# Patient Record
Sex: Female | Born: 1955 | Race: White | Hispanic: No | Marital: Married | Smoking: Never smoker
Health system: Southern US, Community
[De-identification: ages and names within clinical notes are randomized; demographics above are authoritative.]

## PROBLEM LIST (undated history)

## (undated) DIAGNOSIS — Z923 Personal history of irradiation: Secondary | ICD-10-CM

## (undated) DIAGNOSIS — K760 Fatty (change of) liver, not elsewhere classified: Secondary | ICD-10-CM

## (undated) DIAGNOSIS — E785 Hyperlipidemia, unspecified: Secondary | ICD-10-CM

## (undated) DIAGNOSIS — M1990 Unspecified osteoarthritis, unspecified site: Secondary | ICD-10-CM

## (undated) DIAGNOSIS — E039 Hypothyroidism, unspecified: Secondary | ICD-10-CM

## (undated) DIAGNOSIS — K2270 Barrett's esophagus without dysplasia: Secondary | ICD-10-CM

## (undated) DIAGNOSIS — C801 Malignant (primary) neoplasm, unspecified: Secondary | ICD-10-CM

## (undated) DIAGNOSIS — I10 Essential (primary) hypertension: Secondary | ICD-10-CM

## (undated) HISTORY — PX: LARYNX SURGERY: SHX692

## (undated) HISTORY — PX: COLONOSCOPY: SHX174

---

## 1990-09-20 DIAGNOSIS — Z923 Personal history of irradiation: Secondary | ICD-10-CM

## 1990-09-20 DIAGNOSIS — C801 Malignant (primary) neoplasm, unspecified: Secondary | ICD-10-CM

## 1990-09-20 HISTORY — DX: Malignant (primary) neoplasm, unspecified: C80.1

## 1990-09-20 HISTORY — DX: Personal history of irradiation: Z92.3

## 1999-09-30 ENCOUNTER — Other Ambulatory Visit: Admission: RE | Admit: 1999-09-30 | Discharge: 1999-09-30 | Payer: Self-pay | Admitting: Family Medicine

## 2000-09-14 ENCOUNTER — Other Ambulatory Visit: Admission: RE | Admit: 2000-09-14 | Discharge: 2000-09-14 | Payer: Self-pay | Admitting: Family Medicine

## 2004-08-06 ENCOUNTER — Ambulatory Visit: Payer: Self-pay | Admitting: Oncology

## 2004-08-20 ENCOUNTER — Ambulatory Visit: Payer: Self-pay | Admitting: Oncology

## 2005-06-03 ENCOUNTER — Ambulatory Visit: Payer: Self-pay | Admitting: Family Medicine

## 2005-08-05 ENCOUNTER — Ambulatory Visit: Payer: Self-pay | Admitting: Oncology

## 2005-08-20 ENCOUNTER — Ambulatory Visit: Payer: Self-pay | Admitting: Oncology

## 2006-06-06 ENCOUNTER — Ambulatory Visit: Payer: Self-pay | Admitting: Family Medicine

## 2007-09-28 ENCOUNTER — Ambulatory Visit: Payer: Self-pay | Admitting: Family Medicine

## 2007-11-03 ENCOUNTER — Ambulatory Visit: Payer: Self-pay | Admitting: Unknown Physician Specialty

## 2009-07-23 ENCOUNTER — Ambulatory Visit: Payer: Self-pay | Admitting: Family Medicine

## 2011-01-20 ENCOUNTER — Ambulatory Visit: Payer: Self-pay | Admitting: Family Medicine

## 2011-02-01 ENCOUNTER — Ambulatory Visit: Payer: Self-pay | Admitting: Family Medicine

## 2011-04-08 ENCOUNTER — Ambulatory Visit: Payer: Self-pay | Admitting: Unknown Physician Specialty

## 2011-04-10 LAB — PATHOLOGY REPORT

## 2011-08-18 ENCOUNTER — Ambulatory Visit: Payer: Self-pay | Admitting: Surgery

## 2012-03-27 ENCOUNTER — Emergency Department: Payer: Self-pay | Admitting: *Deleted

## 2012-08-22 ENCOUNTER — Ambulatory Visit: Payer: Self-pay | Admitting: Obstetrics and Gynecology

## 2012-08-22 LAB — URINALYSIS, COMPLETE
Bilirubin,UR: NEGATIVE
Blood: NEGATIVE
Glucose,UR: NEGATIVE mg/dL (ref 0–75)
Hyaline Cast: 8
Ketone: NEGATIVE
Nitrite: NEGATIVE
Ph: 5 (ref 4.5–8.0)
Protein: NEGATIVE
RBC,UR: 17 /HPF (ref 0–5)
Specific Gravity: 1.02 (ref 1.003–1.030)
Squamous Epithelial: 2
Transitional Epi: <1
WBC UR: 5 /HPF (ref 0–5)

## 2012-08-22 LAB — POTASSIUM: Potassium: 3.6 mmol/L (ref 3.5–5.1)

## 2012-08-22 LAB — HEMOGLOBIN: HGB: 14.5 g/dL (ref 12.0–16.0)

## 2012-08-25 ENCOUNTER — Ambulatory Visit: Payer: Self-pay | Admitting: Obstetrics and Gynecology

## 2012-08-28 LAB — PATHOLOGY REPORT

## 2012-10-25 ENCOUNTER — Ambulatory Visit: Payer: Self-pay | Admitting: Family Medicine

## 2013-12-04 ENCOUNTER — Ambulatory Visit: Payer: Self-pay | Admitting: Family Medicine

## 2014-05-31 ENCOUNTER — Ambulatory Visit: Payer: Self-pay | Admitting: Unknown Physician Specialty

## 2014-06-04 LAB — PATHOLOGY REPORT

## 2014-06-25 ENCOUNTER — Other Ambulatory Visit (HOSPITAL_COMMUNITY): Payer: Self-pay | Admitting: Cardiovascular Disease

## 2014-06-25 ENCOUNTER — Other Ambulatory Visit: Payer: Self-pay | Admitting: Physician Assistant

## 2014-06-26 NOTE — Telephone Encounter (Signed)
Medication denied. Pt has not been seen by Dr Burt Knack, no records available in [facility].

## 2014-12-10 ENCOUNTER — Ambulatory Visit: Payer: Self-pay | Admitting: Family Medicine

## 2014-12-11 ENCOUNTER — Ambulatory Visit: Payer: Self-pay | Admitting: Family Medicine

## 2014-12-12 ENCOUNTER — Ambulatory Visit: Admit: 2014-12-12 | Disposition: A | Discharge status: Home / Self Care | Payer: Self-pay | Admitting: Family Medicine

## 2014-12-12 HISTORY — PX: BREAST BIOPSY: SHX20

## 2015-01-07 NOTE — Op Note (Signed)
PATIENT NAME:  Brooke Payne, Brooke Payne MR#:  [id number] DATE OF BIRTH:  [birth year]  DATE OF PROCEDURE:  08/25/2012  PREOPERATIVE DIAGNOSIS: Postmenopausal bleeding.   POSTOPERATIVE DIAGNOSIS:  1. Postmenopausal bleeding. 2. Polyps.   PROCEDURES: Dilation and curettage, hysteroscopy.   SURGEON: Ricky L. Amalia Hailey, MD   ANESTHESIA: General endotracheal.   FINDINGS: Hysteroscopic evaluation consistent with polyps primarily from the uterine fundus, pink, fleshy, soft on gross examination. Minimal amount of curettings otherwise.   DRAINS: In and out catheter with a red rubber at the end of the case with approximately  50 mL of clear urine returned.   ANTIBIOTICS: 2 grams Ancef given IV preoperatively.   PROCEDURE IN DETAIL: The patient presented as above. Ultrasound was suspicious for polyps. We recommended dilation and curettage and hysteroscopy. Pt stated understanding  and consent was signed. Preoperative antibiotics were given. She was taken to the operating room and placed in supine position where anesthesia was initiated. She was then placed in the dorsal lithotomy position using Allen stirrups, prepped and draped in the usual sterile fashion. The cervix was visualized and grasped with a single-tooth tenaculum, easily dilated to permit the hysteroscope with findings as noted above. Then alternating sharp curette and polyp forceps and looking with the hysteroscope, we were able to remove the polyps and get a good global sampling.   Final inspection with the hysteroscope showed polyps had been removed. The procedure was felt to achieve maximum efficacy. Instruments were removed. The cervix was visualized and seemed to be hemostatic. The patient was returned to the supine position and left in the care of Anesthesia.  I anticipate a routine postoperative course. She will be discharged home with routine prescriptions and precautions, and I will see her back in two weeks or sooner as needed.    ____________________________ Rockey Situ. Amalia Hailey, MD rle:cbb D: 08/25/2012 08:06:34 ET T: 08/25/2012 10:07:11 ET JOB#: [id number]  cc: Ricky L. Amalia Hailey, MD, <Dictator> Selmer Dominion MD ELECTRONICALLY SIGNED 08/25/2012 11:36

## 2015-01-13 LAB — SURGICAL PATHOLOGY

## 2015-09-21 HISTORY — PX: CERVICAL SPINE SURGERY: SHX589

## 2015-11-04 ENCOUNTER — Other Ambulatory Visit: Payer: Self-pay | Admitting: Orthopedic Surgery

## 2015-11-04 DIAGNOSIS — M4722 Other spondylosis with radiculopathy, cervical region: Secondary | ICD-10-CM

## 2015-11-04 DIAGNOSIS — M5412 Radiculopathy, cervical region: Secondary | ICD-10-CM

## 2015-11-04 DIAGNOSIS — M5030 Other cervical disc degeneration, unspecified cervical region: Secondary | ICD-10-CM

## 2015-11-19 ENCOUNTER — Ambulatory Visit
Admission: RE | Admit: 2015-11-19 | Discharge: 2015-11-19 | Disposition: A | Discharge status: Home / Self Care | Payer: 59 | Source: Ambulatory Visit | Attending: Orthopedic Surgery | Admitting: Orthopedic Surgery

## 2015-11-19 DIAGNOSIS — M5030 Other cervical disc degeneration, unspecified cervical region: Secondary | ICD-10-CM | POA: Insufficient documentation

## 2015-11-19 DIAGNOSIS — M4722 Other spondylosis with radiculopathy, cervical region: Secondary | ICD-10-CM | POA: Insufficient documentation

## 2015-11-19 DIAGNOSIS — M50222 Other cervical disc displacement at C5-C6 level: Secondary | ICD-10-CM | POA: Diagnosis not present

## 2015-11-19 DIAGNOSIS — M4690 Unspecified inflammatory spondylopathy, site unspecified: Secondary | ICD-10-CM | POA: Diagnosis not present

## 2015-11-19 DIAGNOSIS — M5412 Radiculopathy, cervical region: Secondary | ICD-10-CM

## 2016-11-26 ENCOUNTER — Other Ambulatory Visit: Payer: Self-pay | Admitting: Family Medicine

## 2016-11-26 DIAGNOSIS — Z1231 Encounter for screening mammogram for malignant neoplasm of breast: Secondary | ICD-10-CM

## 2016-12-30 ENCOUNTER — Ambulatory Visit
Admission: RE | Admit: 2016-12-30 | Discharge: 2016-12-30 | Disposition: A | Discharge status: Home / Self Care | Payer: BLUE CROSS/BLUE SHIELD | Source: Ambulatory Visit | Attending: Family Medicine | Admitting: Family Medicine

## 2016-12-30 DIAGNOSIS — Z1231 Encounter for screening mammogram for malignant neoplasm of breast: Secondary | ICD-10-CM | POA: Diagnosis present

## 2016-12-30 HISTORY — DX: Malignant (primary) neoplasm, unspecified: C80.1

## 2016-12-30 HISTORY — DX: Personal history of irradiation: Z92.3

## 2017-01-18 ENCOUNTER — Other Ambulatory Visit: Payer: Self-pay | Admitting: Nurse Practitioner

## 2017-01-18 DIAGNOSIS — R748 Abnormal levels of other serum enzymes: Secondary | ICD-10-CM

## 2017-01-25 ENCOUNTER — Ambulatory Visit
Admission: RE | Admit: 2017-01-25 | Discharge: 2017-01-25 | Disposition: A | Discharge status: Home / Self Care | Payer: BLUE CROSS/BLUE SHIELD | Source: Ambulatory Visit | Attending: Nurse Practitioner | Admitting: Nurse Practitioner

## 2017-01-25 DIAGNOSIS — R748 Abnormal levels of other serum enzymes: Secondary | ICD-10-CM | POA: Insufficient documentation

## 2017-11-22 ENCOUNTER — Other Ambulatory Visit: Payer: Self-pay | Admitting: Family Medicine

## 2017-11-22 DIAGNOSIS — Z1231 Encounter for screening mammogram for malignant neoplasm of breast: Secondary | ICD-10-CM

## 2018-01-03 ENCOUNTER — Ambulatory Visit
Admission: RE | Admit: 2018-01-03 | Discharge: 2018-01-03 | Disposition: A | Discharge status: Home / Self Care | Payer: BLUE CROSS/BLUE SHIELD | Source: Ambulatory Visit | Attending: Family Medicine | Admitting: Family Medicine

## 2018-01-03 DIAGNOSIS — Z1231 Encounter for screening mammogram for malignant neoplasm of breast: Secondary | ICD-10-CM | POA: Diagnosis present

## 2018-02-17 ENCOUNTER — Other Ambulatory Visit: Payer: Self-pay

## 2018-02-17 ENCOUNTER — Encounter: Payer: Self-pay | Admitting: *Deleted

## 2018-02-17 ENCOUNTER — Emergency Department
Admission: EM | Admit: 2018-02-17 | Discharge: 2018-02-18 | Disposition: A | Discharge status: Home / Self Care | Payer: BLUE CROSS/BLUE SHIELD | Attending: Student in an Organized Health Care Education/Training Program | Admitting: Student in an Organized Health Care Education/Training Program

## 2018-02-17 DIAGNOSIS — S0591XA Unspecified injury of right eye and orbit, initial encounter: Secondary | ICD-10-CM | POA: Diagnosis present

## 2018-02-17 DIAGNOSIS — S0501XA Injury of conjunctiva and corneal abrasion without foreign body, right eye, initial encounter: Secondary | ICD-10-CM | POA: Diagnosis not present

## 2018-02-17 DIAGNOSIS — X58XXXA Exposure to other specified factors, initial encounter: Secondary | ICD-10-CM | POA: Diagnosis not present

## 2018-02-17 DIAGNOSIS — Y929 Unspecified place or not applicable: Secondary | ICD-10-CM | POA: Insufficient documentation

## 2018-02-17 DIAGNOSIS — Y9389 Activity, other specified: Secondary | ICD-10-CM | POA: Insufficient documentation

## 2018-02-17 DIAGNOSIS — Y999 Unspecified external cause status: Secondary | ICD-10-CM | POA: Diagnosis not present

## 2018-02-17 MED ORDER — TETRACAINE HCL 0.5 % OP SOLN
2.0000 [drp] | Freq: Once | OPHTHALMIC | Status: AC
  Administered 2018-02-17: 2 [drp] via OPHTHALMIC
  Filled 2018-02-17: qty 4

## 2018-02-17 MED ORDER — FLUORESCEIN SODIUM 1 MG OP STRP
1.0000 | ORAL_STRIP | Freq: Once | OPHTHALMIC | Status: AC
  Administered 2018-02-17: 1 via OPHTHALMIC
  Filled 2018-02-17: qty 1

## 2018-02-17 NOTE — ED Triage Notes (Signed)
Pt reports she was unable to get her right eye soft contact out. She says that she went to [facility] prior and they used numbing drops and "got pieces of it out". Redness and irritation to the right eye.

## 2018-02-18 MED ORDER — CIPROFLOXACIN HCL 0.3 % OP SOLN: 1.0000 [drp] | OPHTHALMIC | 0 refills | Status: AC

## 2018-02-18 MED ORDER — CIPROFLOXACIN HCL 0.3 % OP SOLN
2.0000 [drp] | Freq: Once | OPHTHALMIC | Status: AC
  Administered 2018-02-18: 2 [drp] via OPHTHALMIC
  Filled 2018-02-18: qty 2.5

## 2018-02-18 NOTE — ED Provider Notes (Signed)
[facility] Emergency Department Provider Note  ____________________________________________  Time seen: Approximately 3:18 PM  I have reviewed the triage vital signs and the nursing notes.   HISTORY  Chief Complaint Foreign Body in Eye    HPI Brooke Payne is a 62 y.o. female that presents to the emergency department for concern of foreign body in right eye.  Patient states that this afternoon she tried to get her contact out and was unsuccessful.  [facility] and they were "getting pieces out."  She came to the emergency department because [facility] couldn't get the whole contact out and were just removing pieces. She can no longer tell whether it feels like her contact is in her eye because her eye is so irritated.  She denies any symptoms prior to attempting to remove contact. She has been wearing contacts for a couple of months. She denies headache, visual changes, floaters, flashers, eye pain, photophobia, nausea, vomiting, abdominal pain.  Past Medical History:  Diagnosis Date  . 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 Personal history of radiation therapy 1992    There are no active problems to display for this patient.   Past Surgical History:  Procedure Laterality Date  . BREAST BIOPSY Left 12/12/2014   benign    Prior to Admission medications   Medication Sig Start Date End Date Taking? Authorizing Provider  ciprofloxacin (CILOXAN) 0.3 % ophthalmic solution Place 1 drop into the right eye every 2 (two) hours while awake for 5 days. Administer 1 drop, every 2 hours, while awake, for 2 days. Then 1 drop, every 4 hours, while awake, for the next 5 days. 02/18/18 02/23/18  Laban Emperor, PA-C    Allergies Azithromycin  Family History  Problem Relation Age of Onset  . Breast cancer Neg Hx     Social History Social History   Tobacco Use  . Smoking status: Never Smoker  Substance Use Topics  . Alcohol use: Never    Frequency: Never   . Drug use: Never     Review of Systems  Cardiovascular: No chest pain. Respiratory: No SOB. Gastrointestinal: No nausea, no vomiting.  Skin: Negative for rash, abrasions, lacerations, ecchymosis. Neurological: Negative for headaches   ____________________________________________   PHYSICAL EXAM:  VITAL SIGNS: ED Triage Vitals  Enc Vitals Group     BP 02/17/18 2025 (!) 173/98     Pulse Rate 02/17/18 2025 96     Resp 02/17/18 2025 16     Temp 02/17/18 2025 98 F (36.7 C)     Temp Source 02/17/18 2025 Oral     SpO2 02/17/18 2025 100 %     Weight 02/17/18 2025 245 lb (111.1 kg)     Height 02/17/18 2025 5\' 7"  (1.702 m)     Head Circumference --      Peak Flow --      Pain Score 02/17/18 2031 8     Pain Loc --      Pain Edu? --      Excl. in [place]? --      Constitutional: Alert and oriented. Well appearing and in no acute distress. Eyes: Right conjunctiva is injected. PERRL. EOMI. 1/4cm circular area of fluorescene uptake to superior iris and linear corneal defect over right pupil. No visualized foreign body. Head:  ENT:      Ears:      Nose: No congestion/rhinnorhea.      Mouth/Throat: Mucous membranes are moist.  Neck:  No stridor.  Cardiovascular: Good peripheral circulation. Respiratory: Normal respiratory effort without tachypnea or retractions.  Musculoskeletal: Full range of motion to all extremities. No gross deformities appreciated. Neurologic:  Normal speech and language. No gross focal neurologic deficits are appreciated.  Skin:  Skin is warm, dry and intact. No rash noted. Psychiatric: Mood and affect are normal. Speech and behavior are normal. Patient exhibits appropriate insight and judgement.   ____________________________________________   LABS (all labs ordered are listed, but only abnormal results are displayed)  Labs Reviewed - No data to  display ____________________________________________  EKG   ____________________________________________  RADIOLOGY   No results found.  ____________________________________________    PROCEDURES  Procedure(s) performed:    Procedures    Medications  tetracaine (PONTOCAINE) 0.5 % ophthalmic solution 2 drop (2 drops Right Eye Given by Other 02/17/18 2335)  fluorescein ophthalmic strip 1 strip (1 strip Right Eye Given by Other 02/17/18 2335)  ciprofloxacin (CILOXAN) 0.3 % ophthalmic solution 2 drop (2 drops Right Eye Given 02/18/18 0033)     ____________________________________________   INITIAL IMPRESSION / ASSESSMENT AND PLAN / ED COURSE  Pertinent labs & imaging results that were available during my care of the patient were reviewed by me and considered in my medical decision making (see chart for details).  Review of the [place] CSRS was performed in accordance of the [place] prior to dispensing any controlled drugs.   Patient's diagnosis is consistent with corneal abrasion and corneal avulsion. Vital signs and exam are reassuring. Eye was irrigated with morgan lens. I did not visualize any foreign body. I did not attempt any foreign body removal. There is a large area of uptake over iris. I am concerned for a corneal avulsion and corneal abrasion following previous attempted removal of contact . Patient denies any vision changes, eye pain, or systemic symptoms. Patient will be discharged home with prescriptions for ciprofloxacin eye drops. Patient is to follow up with opthamology on Monday. Patient is given ED precautions to return to the ED for any worsening or new symptoms.     ____________________________________________  FINAL CLINICAL IMPRESSION(S) / ED DIAGNOSES  Final diagnoses:  Abrasion of right cornea, initial encounter      NEW MEDICATIONS STARTED DURING THIS VISIT:  ED Discharge Orders        Ordered    ciprofloxacin (CILOXAN) 0.3 % ophthalmic  solution  Every 2 hours while awake     02/18/18 0000          This chart was dictated using voice recognition software/Dragon. Despite best efforts to proofread, errors can occur which can change the meaning. Any change was purely unintentional.    Laban Emperor, PA-C 02/19/18 1537    Merlyn Lot, MD 02/21/18 [phone]

## 2019-01-22 ENCOUNTER — Other Ambulatory Visit: Payer: Self-pay | Admitting: Family Medicine

## 2019-01-22 DIAGNOSIS — Z1231 Encounter for screening mammogram for malignant neoplasm of breast: Secondary | ICD-10-CM

## 2019-03-26 ENCOUNTER — Ambulatory Visit
Admission: RE | Admit: 2019-03-26 | Discharge: 2019-03-26 | Disposition: A | Discharge status: Home / Self Care | Payer: Medicare Other | Source: Ambulatory Visit | Attending: Family Medicine | Admitting: Family Medicine

## 2019-03-26 DIAGNOSIS — Z1231 Encounter for screening mammogram for malignant neoplasm of breast: Secondary | ICD-10-CM

## 2019-10-29 ENCOUNTER — Ambulatory Visit: Payer: Medicare Other | Attending: Internal Medicine

## 2019-10-29 DIAGNOSIS — Z23 Encounter for immunization: Secondary | ICD-10-CM | POA: Insufficient documentation

## 2019-10-29 NOTE — Progress Notes (Signed)
   Covid-19 Vaccination Clinic  Name:  Brooke Payne    MRN: [id number] DOB: [birth year]  10/29/2019  [name] was observed post Covid-19 immunization for 15 minutes without incidence. She was provided with Vaccine Information Sheet and instruction to access the V-Safe system.   [name] was instructed to call 911 with any severe reactions post vaccine: Marland Kitchen Difficulty breathing  . Swelling of your face and throat  . A fast heartbeat  . A bad rash all over your body  . Dizziness and weakness    Immunizations Administered    Name Date Dose VIS Date Route   Pfizer COVID-19 Vaccine 10/29/2019  9:05 AM 0.3 mL 08/31/2019 Intramuscular   Manufacturer: [place]   Lot: [id number]   [place]: [id number]

## 2019-11-21 ENCOUNTER — Ambulatory Visit: Payer: Medicare Other | Attending: Internal Medicine

## 2019-11-21 DIAGNOSIS — Z23 Encounter for immunization: Secondary | ICD-10-CM

## 2019-11-21 NOTE — Progress Notes (Signed)
   Covid-19 Vaccination Clinic  Name:  Brooke Payne    MRN: [id number] DOB: [birth year]  11/21/2019  [name] was observed post Covid-19 immunization for 15 minutes without incident. She was provided with Vaccine Information Sheet and instruction to access the V-Safe system.   [name] was instructed to call 911 with any severe reactions post vaccine: Marland Kitchen Difficulty breathing  . Swelling of face and throat  . A fast heartbeat  . A bad rash all over body  . Dizziness and weakness   Immunizations Administered    Name Date Dose VIS Date Route   Pfizer COVID-19 Vaccine 11/21/2019  9:44 AM 0.3 mL 08/31/2019 Intramuscular   Manufacturer: [place]   Lot: [id number]   [place]: [id number]

## 2020-03-10 ENCOUNTER — Other Ambulatory Visit: Payer: Self-pay | Admitting: Family Medicine

## 2020-03-10 DIAGNOSIS — Z1231 Encounter for screening mammogram for malignant neoplasm of breast: Secondary | ICD-10-CM

## 2020-03-26 ENCOUNTER — Ambulatory Visit
Admission: RE | Admit: 2020-03-26 | Discharge: 2020-03-26 | Disposition: A | Discharge status: Home / Self Care | Payer: Medicare Other | Source: Ambulatory Visit | Attending: Family Medicine | Admitting: Family Medicine

## 2020-03-26 DIAGNOSIS — Z1231 Encounter for screening mammogram for malignant neoplasm of breast: Secondary | ICD-10-CM | POA: Insufficient documentation

## 2020-04-22 ENCOUNTER — Other Ambulatory Visit: Payer: Self-pay | Admitting: Family Medicine

## 2020-04-22 DIAGNOSIS — M5415 Radiculopathy, thoracolumbar region: Secondary | ICD-10-CM

## 2020-05-08 ENCOUNTER — Ambulatory Visit
Admission: RE | Admit: 2020-05-08 | Discharge: 2020-05-08 | Disposition: A | Discharge status: Home / Self Care | Payer: Medicare Other | Source: Ambulatory Visit | Attending: Family Medicine | Admitting: Family Medicine

## 2020-05-08 ENCOUNTER — Other Ambulatory Visit: Payer: Self-pay

## 2020-05-08 DIAGNOSIS — M5415 Radiculopathy, thoracolumbar region: Secondary | ICD-10-CM

## 2021-03-02 ENCOUNTER — Other Ambulatory Visit: Payer: Self-pay | Admitting: Family Medicine

## 2021-03-02 DIAGNOSIS — Z1231 Encounter for screening mammogram for malignant neoplasm of breast: Secondary | ICD-10-CM

## 2021-03-27 ENCOUNTER — Other Ambulatory Visit: Payer: Self-pay

## 2021-03-27 ENCOUNTER — Ambulatory Visit
Admission: RE | Admit: 2021-03-27 | Discharge: 2021-03-27 | Disposition: A | Discharge status: Home / Self Care | Payer: Medicare Other | Source: Ambulatory Visit | Attending: Family Medicine | Admitting: Family Medicine

## 2021-03-27 DIAGNOSIS — Z1231 Encounter for screening mammogram for malignant neoplasm of breast: Secondary | ICD-10-CM

## 2022-02-11 ENCOUNTER — Other Ambulatory Visit: Payer: Self-pay | Admitting: Family Medicine

## 2022-02-11 DIAGNOSIS — Z1231 Encounter for screening mammogram for malignant neoplasm of breast: Secondary | ICD-10-CM

## 2022-03-29 ENCOUNTER — Ambulatory Visit
Admission: RE | Admit: 2022-03-29 | Discharge: 2022-03-29 | Disposition: A | Discharge status: Home / Self Care | Payer: Medicare Other | Source: Ambulatory Visit | Attending: Family Medicine | Admitting: Family Medicine

## 2022-03-29 DIAGNOSIS — Z1231 Encounter for screening mammogram for malignant neoplasm of breast: Secondary | ICD-10-CM | POA: Insufficient documentation

## 2022-04-09 IMAGING — MR MR LUMBAR SPINE W/O CM
5 series · 31 of 48 positions shown · non-contrast
Comparison: None.

CLINICAL DATA: Low back pain right knee pain

EXAM:
MRI LUMBAR SPINE WITHOUT CONTRAST
TECHNIQUE: Multiplanar, multisequence MR imaging of the lumbar spine was
performed. No intravenous contrast was administered.

[Series 5: T2 · sagittal · 4.0mm · 0.81mm/px · 6 of 17 slices shown (1 of 2)]
[im 1/17]
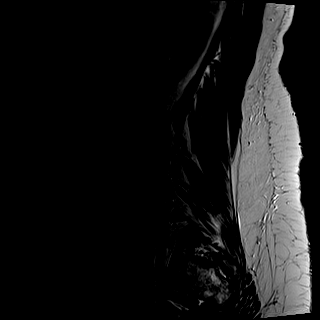
[im 4/17]
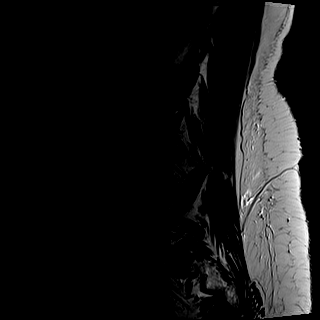
[im 7/17]
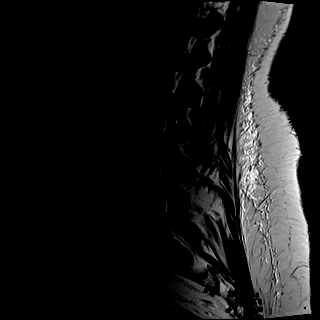
[im 10/17]
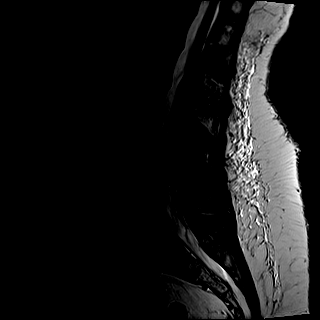
[im 13/17]
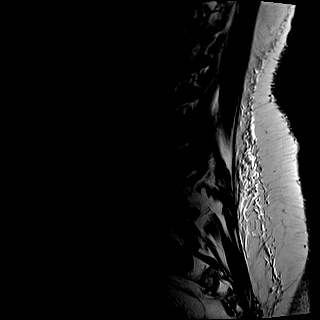
[im 17/17]
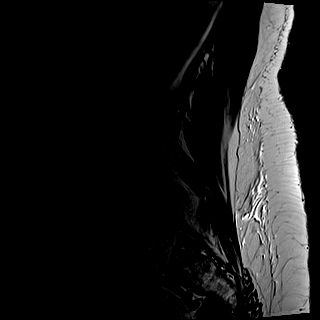

[Series 6: T1 · sagittal · 4.0mm · 0.81mm/px · 7 of 17 slices shown (1 of 2)]
[im 1/17]
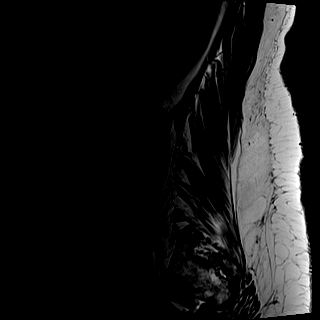
[im 3/17]
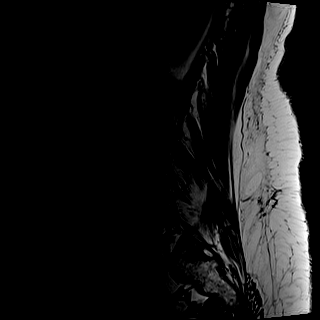
[im 6/17]
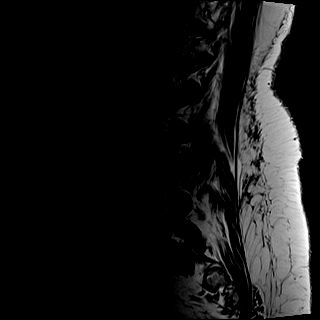
[im 9/17]
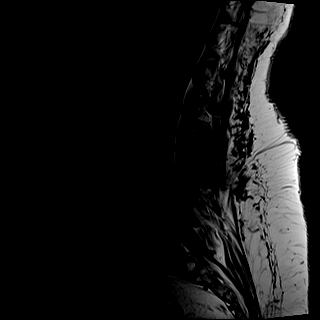
[im 11/17]
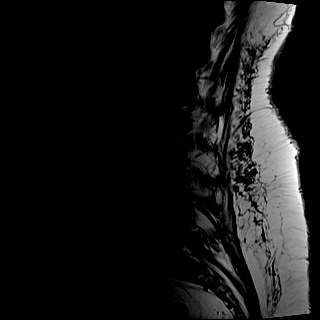
[im 14/17]
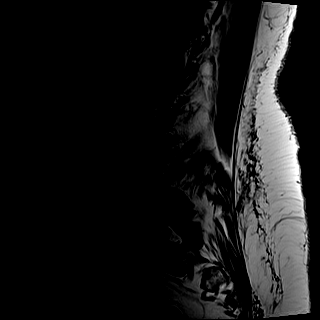
[im 17/17]
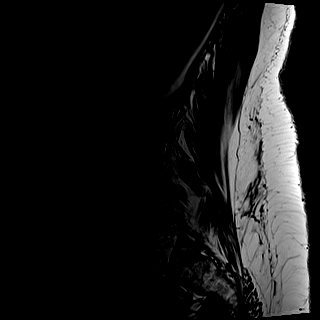

[Series 7: STIR · sagittal · 4.0mm · 0.41mm/px · 2 of 17 slices shown]
[im 1/17]
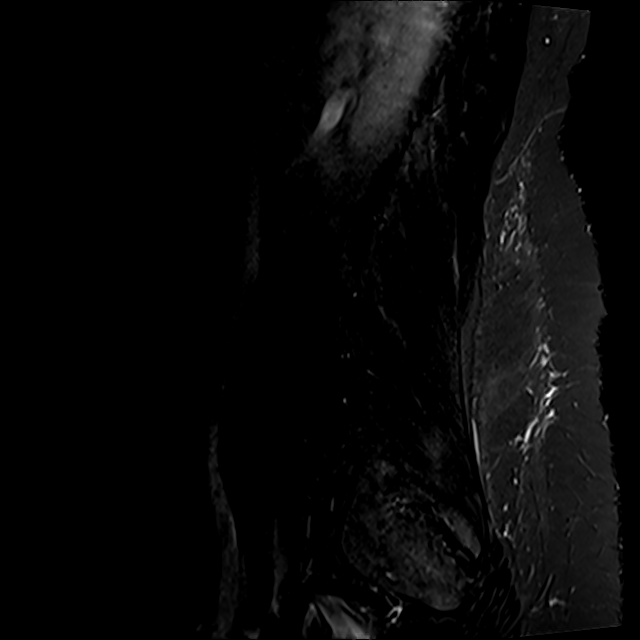
[im 3/17]
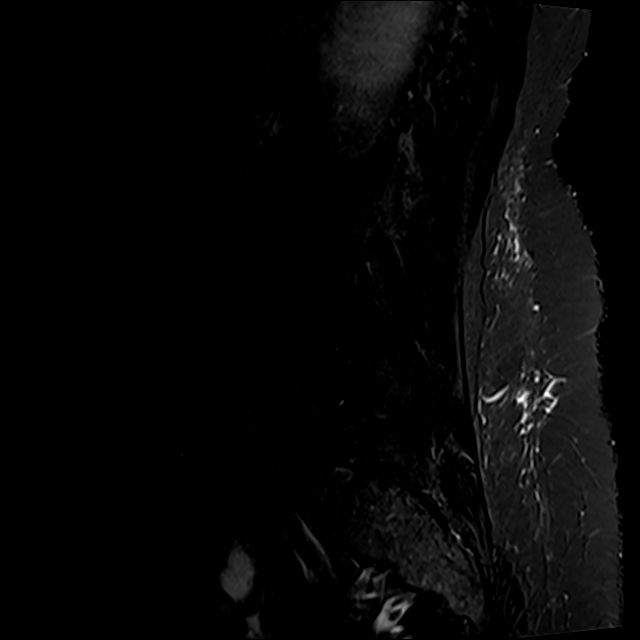

[Series 8: T2 · axial · 4.0mm · 0.78mm/px · z∈[-105,+109]mm · 8 of 37 slices shown (2 of 2)]
[im 1/37]
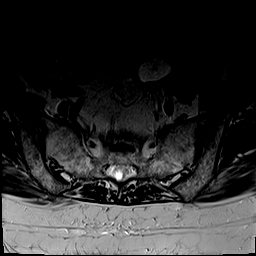
[im 6/37]
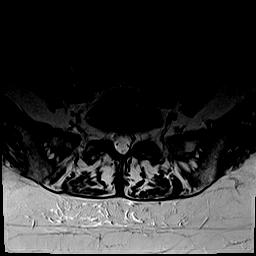
[im 12/37]
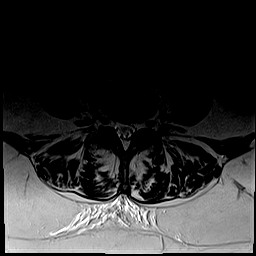
[im 17/37]
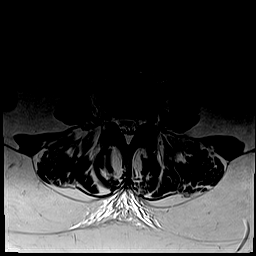
[im 20/37]
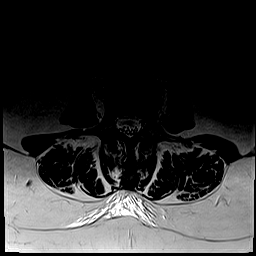
[im 25/37]
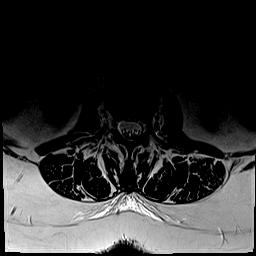
[im 31/37]
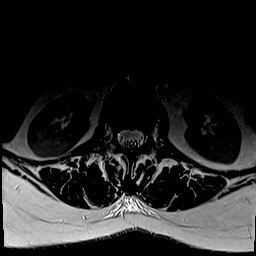
[im 37/37]
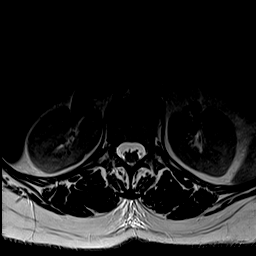

[Series 9: T1 · axial · 4.0mm · 0.39mm/px · z∈[-105,+109]mm · 8 of 37 slices shown (2 of 2)]
[im 1/37]
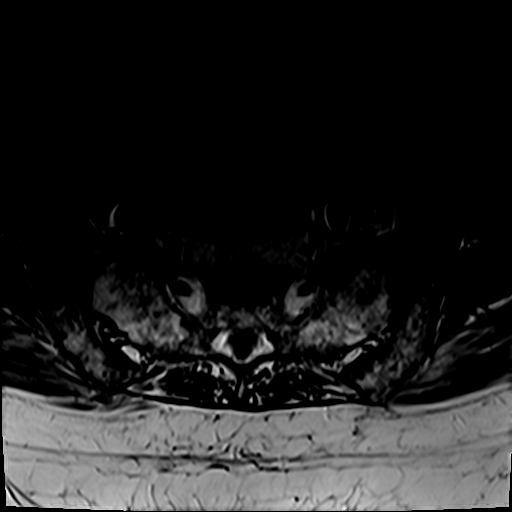
[im 6/37]
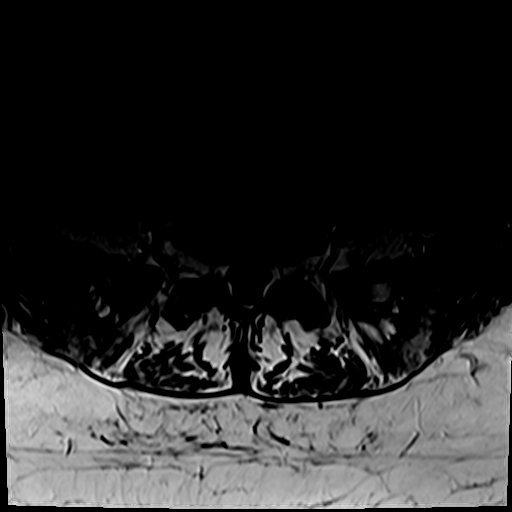
[im 12/37]
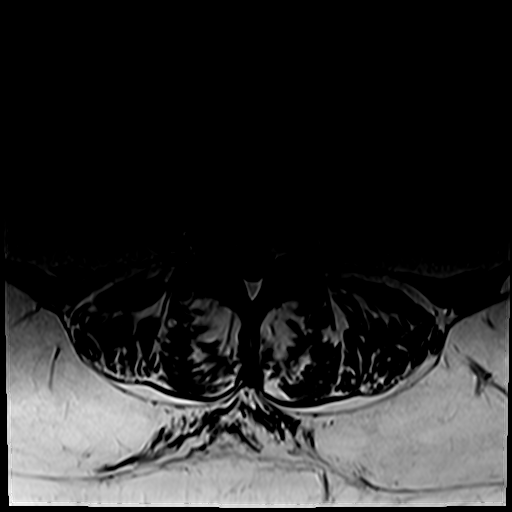
[im 17/37]
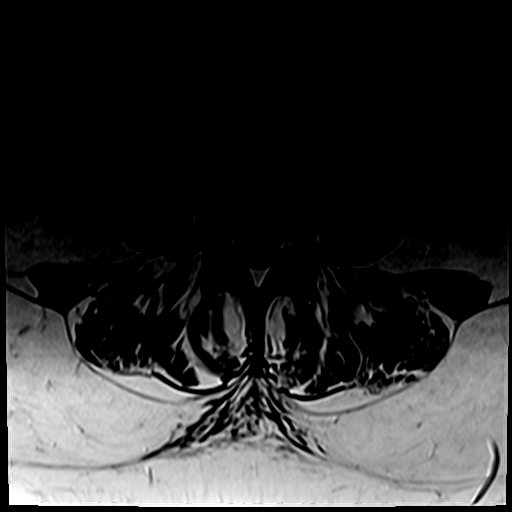
[im 20/37]
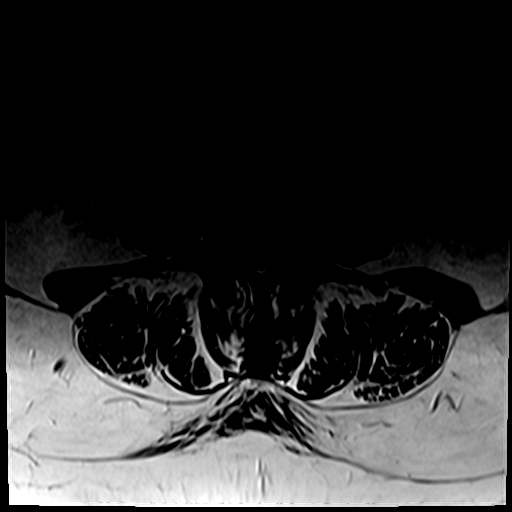
[im 25/37]
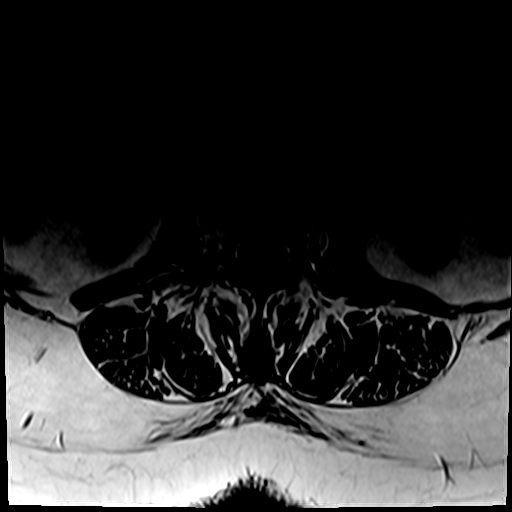
[im 31/37]
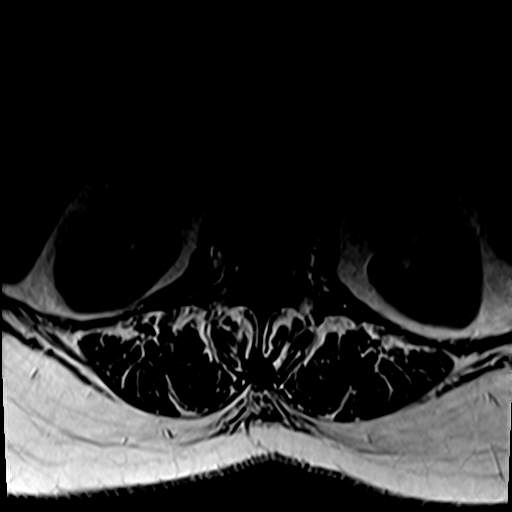
[im 37/37]
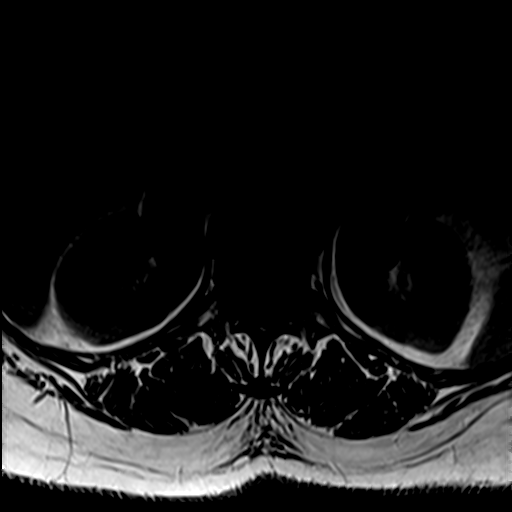

[31 of 48 positions shown; findings below may reference images not displayed]

FINDINGS: Segmentation:  Standard.

Alignment:  Physiologic.

Vertebrae:  No fracture, evidence of discitis, or bone lesion.

Conus medullaris and cauda equina: Conus extends to the L1 level.
Conus and cauda equina appear normal.

Paraspinal and other soft tissues: Negative.

Disc levels:

The visualized levels above L2 are normal.

L2-3: Mild facet hypertrophy.  Otherwise normal.

L3-4: Mild facet hypertrophy. Left extraforaminal annular fissure.
No spinal canal or neural foraminal stenosis.

L4-5: Moderate facet hypertrophy small left asymmetric disc bulge.
No spinal canal or neural foraminal stenosis.

L5-S1: Moderate facet hypertrophy.  No disc herniation or stenosis.
IMPRESSION: 1. Moderate lower lumbar facet arthrosis, which may serve as a
source of local low back pain.
2. Mild lower lumbar degenerative disc disease without spinal canal
or neural foraminal stenosis.

## 2022-09-02 ENCOUNTER — Other Ambulatory Visit: Payer: Self-pay | Admitting: Surgery

## 2022-09-10 ENCOUNTER — Encounter
Admission: RE | Admit: 2022-09-10 | Discharge: 2022-09-10 | Disposition: A | Discharge status: Home / Self Care | Payer: Medicare Other | Source: Ambulatory Visit | Attending: Surgery | Admitting: Surgery

## 2022-09-10 DIAGNOSIS — Z01818 Encounter for other preprocedural examination: Secondary | ICD-10-CM | POA: Diagnosis not present

## 2022-09-10 DIAGNOSIS — Z9889 Other specified postprocedural states: Secondary | ICD-10-CM

## 2022-09-10 DIAGNOSIS — R8290 Unspecified abnormal findings in urine: Secondary | ICD-10-CM | POA: Diagnosis not present

## 2022-09-10 DIAGNOSIS — Z01810 Encounter for preprocedural cardiovascular examination: Secondary | ICD-10-CM | POA: Diagnosis not present

## 2022-09-10 DIAGNOSIS — Z01812 Encounter for preprocedural laboratory examination: Secondary | ICD-10-CM

## 2022-09-10 HISTORY — DX: Hypothyroidism, unspecified: E03.9

## 2022-09-10 HISTORY — DX: Barrett's esophagus without dysplasia: K22.70

## 2022-09-10 HISTORY — DX: Hyperlipidemia, unspecified: E78.5

## 2022-09-10 HISTORY — DX: Essential (primary) hypertension: I10

## 2022-09-10 HISTORY — DX: Fatty (change of) liver, not elsewhere classified: K76.0

## 2022-09-10 HISTORY — DX: Unspecified osteoarthritis, unspecified site: M19.90

## 2022-09-10 LAB — COMPREHENSIVE METABOLIC PANEL
ALT: 30 U/L (ref 0–44)
AST: 27 U/L (ref 15–41)
Albumin: 3.9 g/dL (ref 3.5–5.0)
Alkaline Phosphatase: 37 U/L — ABNORMAL LOW (ref 38–126)
Anion gap: 6 (ref 5–15)
BUN: 10 mg/dL (ref 8–23)
CO2: 26 mmol/L (ref 22–32)
Calcium: 10 mg/dL (ref 8.9–10.3)
Chloride: 105 mmol/L (ref 98–111)
Creatinine, Ser: 0.87 mg/dL (ref 0.44–1.00)
GFR, Estimated: >60 mL/min (ref >60)
Glucose, Bld: 105 mg/dL — ABNORMAL HIGH (ref 70–99)
Potassium: 3.6 mmol/L (ref 3.5–5.1)
Sodium: 137 mmol/L (ref 135–145)
Total Bilirubin: 1.2 mg/dL (ref 0.3–1.2)
Total Protein: 7.4 g/dL (ref 6.5–8.1)

## 2022-09-10 LAB — URINALYSIS, ROUTINE W REFLEX MICROSCOPIC
Bilirubin Urine: NEGATIVE
Glucose, UA: NEGATIVE mg/dL
Hgb urine dipstick: NEGATIVE
Ketones, ur: 5 mg/dL — AB
Nitrite: NEGATIVE
Protein, ur: 30 mg/dL — AB
Specific Gravity, Urine: 1.027 (ref 1.005–1.030)
pH: 5 (ref 5.0–8.0)

## 2022-09-10 LAB — CBC WITH DIFFERENTIAL/PLATELET
Abs Immature Granulocytes: 0.01 10*3/uL (ref 0.00–0.07)
Basophils Absolute: 0 10*3/uL (ref 0.0–0.1)
Basophils Relative: 1 %
Eosinophils Absolute: 0 10*3/uL (ref 0.0–0.5)
Eosinophils Relative: 1 %
HCT: 47.3 % — ABNORMAL HIGH (ref 36.0–46.0)
Hemoglobin: 15.6 g/dL — ABNORMAL HIGH (ref 12.0–15.0)
Immature Granulocytes: 0 %
Lymphocytes Relative: 11 %
Lymphs Abs: 0.4 10*3/uL — ABNORMAL LOW (ref 0.7–4.0)
MCH: 28.2 pg (ref 26.0–34.0)
MCHC: 33 g/dL (ref 30.0–36.0)
MCV: 85.5 fL (ref 80.0–100.0)
Monocytes Absolute: 0.3 10*3/uL (ref 0.1–1.0)
Monocytes Relative: 8 %
Neutro Abs: 3.2 10*3/uL (ref 1.7–7.7)
Neutrophils Relative %: 79 %
Platelets: 196 10*3/uL (ref 150–400)
RBC: 5.53 MIL/uL — ABNORMAL HIGH (ref 3.87–5.11)
RDW: 14 % (ref 11.5–15.5)
WBC: 3.9 10*3/uL — ABNORMAL LOW (ref 4.0–10.5)
nRBC: 0 % (ref 0.0–0.2)

## 2022-09-10 LAB — TYPE AND SCREEN
ABO/RH(D): A POS
Antibody Screen: NEGATIVE

## 2022-09-10 LAB — SURGICAL PCR SCREEN
MRSA, PCR: NEGATIVE
Staphylococcus aureus: NEGATIVE

## 2022-09-10 NOTE — Patient Instructions (Signed)
Your procedure is scheduled on:09-23-21 Thursday Report to the Registration Desk on the 1st floor of the [facility].Then proceed to the 2nd floor Surgery Desk To find out your arrival time, please call [phone] between 1PM - 3PM on:09-22-21 Wednesday If your arrival time is 6:00 am, do not arrive prior to that time as the [place] [facility] entrance doors do not open until 6:00 am.  REMEMBER: Instructions that are not followed completely may result in serious medical risk, up to and including death; or upon the discretion of your surgeon and anesthesiologist your surgery may need to be rescheduled.  Do not eat food after midnight the night before surgery.  No gum chewing, lozengers or hard candies.  You may however, drink CLEAR liquids up to 2 hours before you are scheduled to arrive for your surgery. Do not drink anything within 2 hours of your scheduled arrival time.  Clear liquids include: - water  - apple juice without pulp - gatorade (not RED colors) - black coffee or tea (Do NOT add milk or creamers to the coffee or tea) Do NOT drink anything that is not on this list.  In addition, your doctor has ordered for you to drink the provided  Ensure Pre-Surgery Clear Carbohydrate Drink  Drinking this carbohydrate drink up to two hours before surgery helps to reduce insulin resistance and improve patient outcomes. Please complete drinking 2 hours prior to scheduled arrival time.  TAKE THESE MEDICATIONS THE MORNING OF SURGERY WITH A SIP OF WATER: -levothyroxine (SYNTHROID)  -rosuvastatin (CRESTOR)  -omeprazole (PRILOSEC) -take one the night before and one on the morning of surgery - helps to prevent nausea after surgery.)  One week prior to surgery: Stop Anti-inflammatories (NSAIDS) such as Advil, Aleve, Ibuprofen, Motrin, Naproxen, Naprosyn and Aspirin based products such as Excedrin, Goodys Powder, BC Powder.You may however, continue to take Tylenol if needed for pain up until the day of  surgery. You may continue your celecoxib (CELEBREX) up until the day prior to surgery  Stop ANY OVER THE COUNTER supplements/vitamins 7 days prior to surgery (flaxseed oil)  No Alcohol for 24 hours before or after surgery.  No Smoking including e-cigarettes for 24 hours prior to surgery.  No chewable tobacco products for at least 6 hours prior to surgery.  No nicotine patches on the day of surgery.  Do not use any "recreational" drugs for at least a week prior to your surgery.  Please be advised that the combination of cocaine and anesthesia may have negative outcomes, up to and including death. If you test positive for cocaine, your surgery will be cancelled.  On the morning of surgery brush your teeth with toothpaste and water, you may rinse your mouth with mouthwash if you wish. Do not swallow any toothpaste or mouthwash.  Use CHG Soap as directed on instruction sheet.  Do not wear jewelry, make-up, hairpins, clips or nail polish.  Do not wear lotions, powders, or perfumes.   Do not shave body from the neck down 48 hours prior to surgery just in case you cut yourself which could leave a site for infection.  Also, freshly shaved skin may become irritated if using the CHG soap.  Contact lenses, hearing aids and dentures may not be worn into surgery.  Do not bring valuables to the hospital. [facility] is not responsible for any missing/lost belongings or valuables.  Notify your doctor if there is any change in your medical condition (cold, fever, infection).  Wear comfortable clothing (specific  to your surgery type) to the hospital.  After surgery, you can help prevent lung complications by doing breathing exercises.  Take deep breaths and cough every 1-2 hours. Your doctor may order a device called an Incentive Spirometer to help you take deep breaths. When coughing or sneezing, hold a pillow firmly against your incision with both hands. This is called "splinting." Doing this  helps protect your incision. It also decreases belly discomfort.  If you are being admitted to the hospital overnight, leave your suitcase in the car. After surgery it may be brought to your room.  If you are being discharged the day of surgery, you will not be allowed to drive home. You will need a responsible adult (18 years or older) to drive you home and stay with you that night.   If you are taking public transportation, you will need to have a responsible adult (18 years or older) with you. Please confirm with your physician that it is acceptable to use public transportation.   Please call the [place] [facility] at [phone] if you have any questions about these instructions.  Surgery Visitation Policy:  Patients undergoing a surgery or procedure may have two family members or support persons with them as long as the person is not COVID-19 positive or experiencing its symptoms.   Inpatient Visitation:    Visiting hours are 7 a.m. to 8 p.m. Up to four visitors are allowed at one time in a patient room. The visitors may rotate out with other people during the day. One designated support person (adult) may remain overnight.  Due to an increase in RSV and influenza rates and associated hospitalizations, children ages 36 and under will not be able to visit patients in [facility]. Masks continue to be strongly recommended.   How to Use an Incentive Spirometer An incentive spirometer is a tool that measures how well you are filling your lungs with each breath. Learning to take long, deep breaths using this tool can help you keep your lungs clear and active. This may help to reverse or lessen your chance of developing breathing (pulmonary) problems, especially infection. You may be asked to use a spirometer: After a surgery. If you have a lung problem or a history of smoking. After a long period of time when you have been unable to move or be active. If the  spirometer includes an indicator to show the highest number that you have reached, your health care provider or respiratory therapist will help you set a goal. Keep a log of your progress as told by your health care provider. What are the risks? Breathing too quickly may cause dizziness or cause you to pass out. Take your time so you do not get dizzy or light-headed. If you are in pain, you may need to take pain medicine before doing incentive spirometry. It is harder to take a deep breath if you are having pain. How to use your incentive spirometer  Sit up on the edge of your bed or on a chair. Hold the incentive spirometer so that it is in an upright position. Before you use the spirometer, breathe out normally. Place the mouthpiece in your mouth. Make sure your lips are closed tightly around it. Breathe in slowly and as deeply as you can through your mouth, causing the piston or the ball to rise toward the top of the chamber. Hold your breath for 3-5 seconds, or for as long as possible. If the spirometer includes  a coach indicator, use this to guide you in breathing. Slow down your breathing if the indicator goes above the marked areas. Remove the mouthpiece from your mouth and breathe out normally. The piston or ball will return to the bottom of the chamber. Rest for a few seconds, then repeat the steps 10 or more times. Take your time and take a few normal breaths between deep breaths so that you do not get dizzy or light-headed. Do this every 1-2 hours when you are awake. If the spirometer includes a goal marker to show the highest number you have reached (best effort), use this as a goal to work toward during each repetition. After each set of 10 deep breaths, cough a few times. This will help to make sure that your lungs are clear. If you have an incision on your chest or abdomen from surgery, place a pillow or a rolled-up towel firmly against the incision when you cough. This can help to  reduce pain while taking deep breaths and coughing. General tips When you are able to get out of bed: Walk around often. Continue to take deep breaths and cough in order to clear your lungs. Keep using the incentive spirometer until your health care provider says it is okay to stop using it. If you have been in the hospital, you may be told to keep using the spirometer at home. Contact a health care provider if: You are having difficulty using the spirometer. You have trouble using the spirometer as often as instructed. Your pain medicine is not giving enough relief for you to use the spirometer as told. You have a fever. Get help right away if: You develop shortness of breath. You develop a cough with bloody mucus from the lungs. You have fluid or blood coming from an incision site after you cough. Summary An incentive spirometer is a tool that can help you learn to take long, deep breaths to keep your lungs clear and active. You may be asked to use a spirometer after a surgery, if you have a lung problem or a history of smoking, or if you have been inactive for a long period of time. Use your incentive spirometer as instructed every 1-2 hours while you are awake. If you have an incision on your chest or abdomen, place a pillow or a rolled-up towel firmly against your incision when you cough. This will help to reduce pain. Get help right away if you have shortness of breath, you cough up bloody mucus, or blood comes from your incision when you cough. This information is not intended to replace advice given to you by your health care provider. Make sure you discuss any questions you have with your health care provider. Document Revised: 11/26/2019 Document Reviewed: 11/26/2019 Elsevier Patient Education  [place].

## 2022-09-11 LAB — URINE CULTURE: Culture: 10000 — AB

## 2022-09-22 ENCOUNTER — Encounter: Payer: Self-pay | Admitting: Surgery

## 2022-09-22 MED ORDER — LACTATED RINGERS IV SOLN: INTRAVENOUS | Status: DC

## 2022-09-22 MED ORDER — CHLORHEXIDINE GLUCONATE 0.12 % MT SOLN: 15.0000 mL | Freq: Once | OROMUCOSAL | Status: AC

## 2022-09-22 MED ORDER — CEFAZOLIN SODIUM-DEXTROSE 2-4 GM/100ML-% IV SOLN
2.0000 g | INTRAVENOUS | Status: AC
  Administered 2022-09-23: 2 g via INTRAVENOUS

## 2022-09-22 MED ORDER — ORAL CARE MOUTH RINSE: 15.0000 mL | Freq: Once | OROMUCOSAL | Status: AC

## 2022-09-23 ENCOUNTER — Encounter: Payer: Self-pay | Admitting: Surgery

## 2022-09-23 ENCOUNTER — Ambulatory Visit
Admission: RE | Admit: 2022-09-23 | Discharge: 2022-09-23 | Disposition: A | Discharge status: Home / Self Care | Payer: Medicare Other | Source: Ambulatory Visit | Attending: Surgery | Admitting: Surgery

## 2022-09-23 ENCOUNTER — Other Ambulatory Visit: Payer: Self-pay

## 2022-09-23 ENCOUNTER — Ambulatory Visit: Payer: Medicare Other | Admitting: Registered Nurse

## 2022-09-23 ENCOUNTER — Encounter
Admission: RE | Disposition: A | Discharge status: Home / Self Care | Payer: Self-pay | Source: Ambulatory Visit | Attending: Surgery

## 2022-09-23 ENCOUNTER — Ambulatory Visit: Payer: Medicare Other | Admitting: Urgent Care

## 2022-09-23 ENCOUNTER — Ambulatory Visit: Payer: Medicare Other

## 2022-09-23 DIAGNOSIS — E039 Hypothyroidism, unspecified: Secondary | ICD-10-CM | POA: Diagnosis not present

## 2022-09-23 DIAGNOSIS — M1990 Unspecified osteoarthritis, unspecified site: Secondary | ICD-10-CM | POA: Diagnosis not present

## 2022-09-23 DIAGNOSIS — I10 Essential (primary) hypertension: Secondary | ICD-10-CM | POA: Insufficient documentation

## 2022-09-23 DIAGNOSIS — Z6841 Body Mass Index (BMI) 40.0 and over, adult: Secondary | ICD-10-CM | POA: Diagnosis not present

## 2022-09-23 DIAGNOSIS — Z8521 Personal history of malignant neoplasm of larynx: Secondary | ICD-10-CM | POA: Insufficient documentation

## 2022-09-23 DIAGNOSIS — M1711 Unilateral primary osteoarthritis, right knee: Secondary | ICD-10-CM | POA: Insufficient documentation

## 2022-09-23 DIAGNOSIS — Z923 Personal history of irradiation: Secondary | ICD-10-CM | POA: Insufficient documentation

## 2022-09-23 DIAGNOSIS — K219 Gastro-esophageal reflux disease without esophagitis: Secondary | ICD-10-CM | POA: Insufficient documentation

## 2022-09-23 HISTORY — PX: TOTAL KNEE ARTHROPLASTY: SHX125

## 2022-09-23 LAB — ABO/RH: ABO/RH(D): A POS

## 2022-09-23 SURGERY — ARTHROPLASTY, KNEE, TOTAL
Anesthesia: General | Site: Knee | Laterality: Right

## 2022-09-23 MED ORDER — BUPIVACAINE HCL (PF) 0.5 % IJ SOLN
INTRAMUSCULAR | Status: DC | PRN
  Administered 2022-09-23: 2.6 mL

## 2022-09-23 MED ORDER — TRANEXAMIC ACID 1000 MG/10ML IV SOLN
INTRAVENOUS | Status: AC
  Filled 2022-09-23: qty 10

## 2022-09-23 MED ORDER — OXYCODONE HCL 5 MG/5ML PO SOLN: 5.0000 mg | Freq: Once | ORAL | Status: AC | PRN

## 2022-09-23 MED ORDER — DEXAMETHASONE SODIUM PHOSPHATE 10 MG/ML IJ SOLN
INTRAMUSCULAR | Status: DC | PRN
  Administered 2022-09-23: 10 mg via INTRAVENOUS

## 2022-09-23 MED ORDER — PROPOFOL 1000 MG/100ML IV EMUL
INTRAVENOUS | Status: AC
  Filled 2022-09-23: qty 100

## 2022-09-23 MED ORDER — METOCLOPRAMIDE HCL 5 MG/ML IJ SOLN: 5.0000 mg | Freq: Three times a day (TID) | INTRAMUSCULAR | Status: DC | PRN

## 2022-09-23 MED ORDER — SODIUM CHLORIDE 0.9 % BOLUS PEDS
250.0000 mL | Freq: Once | INTRAVENOUS | Status: AC
  Administered 2022-09-23: 250 mL via INTRAVENOUS

## 2022-09-23 MED ORDER — CEFAZOLIN SODIUM-DEXTROSE 2-4 GM/100ML-% IV SOLN
INTRAVENOUS | Status: AC
  Filled 2022-09-23: qty 100

## 2022-09-23 MED ORDER — FENTANYL CITRATE (PF) 100 MCG/2ML IJ SOLN: 25.0000 ug | INTRAMUSCULAR | Status: DC | PRN

## 2022-09-23 MED ORDER — CEFAZOLIN SODIUM-DEXTROSE 2-4 GM/100ML-% IV SOLN: 2.0000 g | Freq: Four times a day (QID) | INTRAVENOUS | Status: DC

## 2022-09-23 MED ORDER — TRIAMCINOLONE ACETONIDE 40 MG/ML IJ SUSP
INTRAMUSCULAR | Status: AC
  Filled 2022-09-23: qty 2

## 2022-09-23 MED ORDER — SODIUM CHLORIDE FLUSH 0.9 % IV SOLN
INTRAVENOUS | Status: AC
  Filled 2022-09-23: qty 40

## 2022-09-23 MED ORDER — BUPIVACAINE LIPOSOME 1.3 % IJ SUSP
INTRAMUSCULAR | Status: AC
  Filled 2022-09-23: qty 20

## 2022-09-23 MED ORDER — KETOROLAC TROMETHAMINE 15 MG/ML IJ SOLN
INTRAMUSCULAR | Status: AC
  Administered 2022-09-23: 15 mg via INTRAVENOUS
  Filled 2022-09-23: qty 1

## 2022-09-23 MED ORDER — PHENYLEPHRINE HCL (PRESSORS) 10 MG/ML IV SOLN
INTRAVENOUS | Status: AC
  Filled 2022-09-23: qty 1

## 2022-09-23 MED ORDER — OXYCODONE HCL 5 MG PO TABS: 5.0000 mg | ORAL_TABLET | ORAL | 0 refills | Status: AC | PRN

## 2022-09-23 MED ORDER — PROPOFOL 10 MG/ML IV BOLUS
INTRAVENOUS | Status: DC | PRN
  Administered 2022-09-23: 30 mg via INTRAVENOUS

## 2022-09-23 MED ORDER — LIDOCAINE HCL (PF) 1 % IJ SOLN
INTRAMUSCULAR | Status: DC | PRN
  Administered 2022-09-23 (×2): 3 mL

## 2022-09-23 MED ORDER — PHENYLEPHRINE HCL-NACL 20-0.9 MG/250ML-% IV SOLN
INTRAVENOUS | Status: AC
  Filled 2022-09-23: qty 250

## 2022-09-23 MED ORDER — CHLORHEXIDINE GLUCONATE 0.12 % MT SOLN
OROMUCOSAL | Status: AC
  Administered 2022-09-23: 15 mL via OROMUCOSAL
  Filled 2022-09-23: qty 15

## 2022-09-23 MED ORDER — MIDAZOLAM HCL 2 MG/2ML IJ SOLN
INTRAMUSCULAR | Status: AC
  Filled 2022-09-23: qty 2

## 2022-09-23 MED ORDER — OXYCODONE HCL 5 MG PO TABS: 5.0000 mg | ORAL_TABLET | ORAL | Status: DC | PRN

## 2022-09-23 MED ORDER — ONDANSETRON HCL 4 MG/2ML IJ SOLN: 4.0000 mg | Freq: Once | INTRAMUSCULAR | Status: DC | PRN

## 2022-09-23 MED ORDER — OXYCODONE HCL 5 MG PO TABS
ORAL_TABLET | ORAL | Status: AC
  Filled 2022-09-23: qty 1

## 2022-09-23 MED ORDER — FENTANYL CITRATE (PF) 100 MCG/2ML IJ SOLN
INTRAMUSCULAR | Status: AC
  Filled 2022-09-23: qty 2

## 2022-09-23 MED ORDER — FENTANYL CITRATE (PF) 100 MCG/2ML IJ SOLN
INTRAMUSCULAR | Status: DC | PRN
  Administered 2022-09-23: 50 ug via INTRAVENOUS

## 2022-09-23 MED ORDER — BUPIVACAINE-EPINEPHRINE (PF) 0.5% -1:200000 IJ SOLN
INTRAMUSCULAR | Status: DC | PRN
  Administered 2022-09-23: 30 mL

## 2022-09-23 MED ORDER — ACETAMINOPHEN 10 MG/ML IV SOLN
INTRAVENOUS | Status: AC
  Filled 2022-09-23: qty 100

## 2022-09-23 MED ORDER — PHENYLEPHRINE HCL-NACL 20-0.9 MG/250ML-% IV SOLN
INTRAVENOUS | Status: DC | PRN
  Administered 2022-09-23: 20 ug/min via INTRAVENOUS

## 2022-09-23 MED ORDER — SODIUM CHLORIDE 0.9 % IR SOLN
Status: DC | PRN
  Administered 2022-09-23: 3000 mL

## 2022-09-23 MED ORDER — TRIAMCINOLONE ACETONIDE 40 MG/ML IJ SUSP
INTRAMUSCULAR | Status: DC | PRN
  Administered 2022-09-23: 80 mg

## 2022-09-23 MED ORDER — OXYCODONE HCL 5 MG PO TABS
5.0000 mg | ORAL_TABLET | Freq: Once | ORAL | Status: AC | PRN
  Administered 2022-09-23: 5 mg via ORAL

## 2022-09-23 MED ORDER — TRANEXAMIC ACID 1000 MG/10ML IV SOLN
INTRAVENOUS | Status: DC | PRN
  Administered 2022-09-23: 1000 mg via TOPICAL

## 2022-09-23 MED ORDER — ONDANSETRON HCL 4 MG/2ML IJ SOLN
INTRAMUSCULAR | Status: AC
  Filled 2022-09-23: qty 2

## 2022-09-23 MED ORDER — BUPIVACAINE-EPINEPHRINE (PF) 0.5% -1:200000 IJ SOLN
INTRAMUSCULAR | Status: AC
  Filled 2022-09-23: qty 30

## 2022-09-23 MED ORDER — ONDANSETRON HCL 4 MG PO TABS: 4.0000 mg | ORAL_TABLET | Freq: Four times a day (QID) | ORAL | Status: DC | PRN

## 2022-09-23 MED ORDER — ONDANSETRON HCL 4 MG/2ML IJ SOLN
INTRAMUSCULAR | Status: DC | PRN
  Administered 2022-09-23: 4 mg via INTRAVENOUS

## 2022-09-23 MED ORDER — PROPOFOL 500 MG/50ML IV EMUL
INTRAVENOUS | Status: DC | PRN
  Administered 2022-09-23: 125 ug/kg/min via INTRAVENOUS

## 2022-09-23 MED ORDER — ACETAMINOPHEN 10 MG/ML IV SOLN
INTRAVENOUS | Status: DC | PRN
  Administered 2022-09-23: 1000 mg via INTRAVENOUS

## 2022-09-23 MED ORDER — CEFAZOLIN SODIUM-DEXTROSE 2-4 GM/100ML-% IV SOLN
INTRAVENOUS | Status: AC
  Administered 2022-09-23: 2 g via INTRAVENOUS
  Filled 2022-09-23: qty 100

## 2022-09-23 MED ORDER — KETOROLAC TROMETHAMINE 30 MG/ML IJ SOLN
INTRAMUSCULAR | Status: AC
  Filled 2022-09-23: qty 1

## 2022-09-23 MED ORDER — KETOROLAC TROMETHAMINE 30 MG/ML IJ SOLN
INTRAMUSCULAR | Status: DC | PRN
  Administered 2022-09-23: 30 mg via INTRAVENOUS

## 2022-09-23 MED ORDER — ACETAMINOPHEN 10 MG/ML IV SOLN: 1000.0000 mg | Freq: Once | INTRAVENOUS | Status: DC | PRN

## 2022-09-23 MED ORDER — METOCLOPRAMIDE HCL 10 MG PO TABS: 5.0000 mg | ORAL_TABLET | Freq: Three times a day (TID) | ORAL | Status: DC | PRN

## 2022-09-23 MED ORDER — MIDAZOLAM HCL 5 MG/5ML IJ SOLN
INTRAMUSCULAR | Status: DC | PRN
  Administered 2022-09-23: 1 mg via INTRAVENOUS
  Administered 2022-09-23: 2 mg via INTRAVENOUS

## 2022-09-23 MED ORDER — 0.9 % SODIUM CHLORIDE (POUR BTL) OPTIME
TOPICAL | Status: DC | PRN
  Administered 2022-09-23: 500 mL

## 2022-09-23 MED ORDER — SODIUM CHLORIDE 0.9 % IV SOLN
INTRAVENOUS | Status: DC | PRN
  Administered 2022-09-23: 60 mL

## 2022-09-23 MED ORDER — ONDANSETRON HCL 4 MG/2ML IJ SOLN: 4.0000 mg | Freq: Four times a day (QID) | INTRAMUSCULAR | Status: DC | PRN

## 2022-09-23 MED ORDER — KETOROLAC TROMETHAMINE 15 MG/ML IJ SOLN: 15.0000 mg | Freq: Once | INTRAMUSCULAR | Status: AC

## 2022-09-23 MED ORDER — LACTATED RINGERS IV SOLN: INTRAVENOUS | Status: DC

## 2022-09-23 MED ORDER — DEXAMETHASONE SODIUM PHOSPHATE 10 MG/ML IJ SOLN
INTRAMUSCULAR | Status: AC
  Filled 2022-09-23: qty 1

## 2022-09-23 MED ORDER — LIDOCAINE HCL (PF) 1 % IJ SOLN
INTRAMUSCULAR | Status: AC
  Filled 2022-09-23: qty 30

## 2022-09-23 MED ORDER — SODIUM CHLORIDE 0.9 % IV SOLN: INTRAVENOUS | Status: DC

## 2022-09-23 MED ORDER — APIXABAN 2.5 MG PO TABS: 2.5000 mg | ORAL_TABLET | Freq: Two times a day (BID) | ORAL | 0 refills | Status: AC

## 2022-09-23 SURGICAL SUPPLY — 65 items
APL PRP STRL LF DISP 70% ISPRP (MISCELLANEOUS) ×2
BIT DRILL QUICK REL 1/8 2PK SL (DRILL) IMPLANT
BLADE SAW SAG 25X90X1.19 (BLADE) ×1 IMPLANT
BLADE SURG SZ20 CARB STEEL (BLADE) ×1 IMPLANT
BNDG CMPR STD VLCR NS LF 5.8X6 (GAUZE/BANDAGES/DRESSINGS) ×1
BNDG ELASTIC 6X5.8 VLCR NS LF (GAUZE/BANDAGES/DRESSINGS) ×1 IMPLANT
BRNG TIB 0D 75X10 ANT STAB (Insert) ×1 IMPLANT
CEMENT BONE R 1X40 (Cement) ×2 IMPLANT
CEMENT VACUUM MIXING SYSTEM (MISCELLANEOUS) ×1 IMPLANT
CHLORAPREP W/TINT 26 (MISCELLANEOUS) ×1 IMPLANT
COMPONENT PATELLAR VGD 7.8X3 (Joint) IMPLANT
COOLER POLAR GLACIER W/PUMP (MISCELLANEOUS) ×1 IMPLANT
COVER MAYO STAND REUSABLE (DRAPES) ×1 IMPLANT
CUFF TOURN SGL QUICK 24 (TOURNIQUET CUFF)
CUFF TOURN SGL QUICK 34 (TOURNIQUET CUFF)
CUFF TRNQT CYL 24X4X16.5-23 (TOURNIQUET CUFF) IMPLANT
CUFF TRNQT CYL 34X4.125X (TOURNIQUET CUFF) IMPLANT
DRAPE 3/4 80X56 (DRAPES) ×1 IMPLANT
DRAPE IMP U-DRAPE 54X76 (DRAPES) ×1 IMPLANT
DRAPE U-SHAPE 47X51 STRL (DRAPES) ×1 IMPLANT
DRILL QUICK RELEASE 1/8 INCH (DRILL) ×6
DRSG MEPILEX SACRM 8.7X9.8 (GAUZE/BANDAGES/DRESSINGS) IMPLANT
DRSG OPSITE POSTOP 4X10 (GAUZE/BANDAGES/DRESSINGS) ×1 IMPLANT
DRSG OPSITE POSTOP 4X8 (GAUZE/BANDAGES/DRESSINGS) ×1 IMPLANT
ELECT REM PT RETURN 9FT ADLT (ELECTROSURGICAL) ×1
ELECTRODE REM PT RTRN 9FT ADLT (ELECTROSURGICAL) ×1 IMPLANT
FEMORAL COMPONENT 70MM RIGHT (Joint) IMPLANT
GAUZE XEROFORM 1X8 LF (GAUZE/BANDAGES/DRESSINGS) ×1 IMPLANT
GLOVE BIO SURGEON STRL SZ7.5 (GLOVE) ×4 IMPLANT
GLOVE BIO SURGEON STRL SZ8 (GLOVE) ×4 IMPLANT
GLOVE BIOGEL PI IND STRL 8 (GLOVE) ×1 IMPLANT
GLOVE SURG UNDER LTX SZ8 (GLOVE) ×1 IMPLANT
GOWN STRL REUS W/ TWL LRG LVL3 (GOWN DISPOSABLE) ×1 IMPLANT
GOWN STRL REUS W/ TWL XL LVL3 (GOWN DISPOSABLE) ×1 IMPLANT
GOWN STRL REUS W/TWL LRG LVL3 (GOWN DISPOSABLE) ×1
GOWN STRL REUS W/TWL XL LVL3 (GOWN DISPOSABLE) ×1
HANDLE YANKAUER SUCT OPEN TIP (MISCELLANEOUS) ×1 IMPLANT
HOOD PEEL AWAY T7 (MISCELLANEOUS) ×3 IMPLANT
INSERT TIB BEARING 75 10 (Insert) IMPLANT
IV NS IRRIG 3000ML ARTHROMATIC (IV SOLUTION) ×1 IMPLANT
KIT TURNOVER KIT A (KITS) ×1 IMPLANT
MANIFOLD NEPTUNE II (INSTRUMENTS) ×1 IMPLANT
NDL SAFETY ECLIP 18X1.5 (MISCELLANEOUS) IMPLANT
NDL SPNL 20GX3.5 QUINCKE YW (NEEDLE) ×1 IMPLANT
NEEDLE SPNL 20GX3.5 QUINCKE YW (NEEDLE) ×1 IMPLANT
NS IRRIG 1000ML POUR BTL (IV SOLUTION) ×1 IMPLANT
PACK TOTAL KNEE (MISCELLANEOUS) ×1 IMPLANT
PAD WRAPON POLAR KNEE (MISCELLANEOUS) ×1 IMPLANT
PENCIL SMOKE EVACUATOR (MISCELLANEOUS) ×1 IMPLANT
PLATE KNEE TIBIAL 75MM FIXED (Plate) IMPLANT
PULSAVAC PLUS IRRIG FAN TIP (DISPOSABLE) ×1
STAPLER SKIN PROX 35W (STAPLE) ×1 IMPLANT
SUCTION FRAZIER HANDLE 10FR (MISCELLANEOUS) ×1
SUCTION TUBE FRAZIER 10FR DISP (MISCELLANEOUS) ×1 IMPLANT
SUT VIC AB 0 CT1 36 (SUTURE) ×3 IMPLANT
SUT VIC AB 2-0 CT1 27 (SUTURE) ×3
SUT VIC AB 2-0 CT1 TAPERPNT 27 (SUTURE) ×3 IMPLANT
SYR 10ML LL (SYRINGE) ×1 IMPLANT
SYR 20ML LL LF (SYRINGE) ×1 IMPLANT
SYR 30ML LL (SYRINGE) IMPLANT
SYR 3ML LL SCALE MARK (SYRINGE) IMPLANT
TIP FAN IRRIG PULSAVAC PLUS (DISPOSABLE) ×1 IMPLANT
TRAP FLUID SMOKE EVACUATOR (MISCELLANEOUS) ×2 IMPLANT
WATER STERILE IRR 500ML POUR (IV SOLUTION) ×1 IMPLANT
WRAPON POLAR PAD KNEE (MISCELLANEOUS) ×1

## 2022-09-23 NOTE — Anesthesia Postprocedure Evaluation (Signed)
Anesthesia Post Note  Patient: Brooke Payne  Procedure(s) Performed: TOTAL KNEE ARTHROPLASTY (Right: Knee)  Patient location during evaluation: PACU Anesthesia Type: Spinal Level of consciousness: awake and alert, oriented and patient cooperative Pain management: pain level controlled Vital Signs Assessment: post-procedure vital signs reviewed and stable Respiratory status: spontaneous breathing, nonlabored ventilation and respiratory function stable Cardiovascular status: blood pressure returned to baseline and stable Postop Assessment: adequate PO intake, no headache, spinal receding and no apparent nausea or vomiting Anesthetic complications: no   No notable events documented.   Last Vitals:  Vitals:   09/23/22 1045 09/23/22 1059  BP: (!) 91/58 126/68  Pulse: 73 70  Resp: 14 15  Temp: (!) 36.2 C (!) 36.3 C  SpO2: 97% 95%    Last Pain:  Vitals:   09/23/22 1059  TempSrc: Temporal  PainSc: 0-No pain                 Darrin Nipper

## 2022-09-23 NOTE — TOC Progression Note (Signed)
Transition of Care [facility]) - Progression Note    Patient Details  Name: Brooke Payne MRN: [id number] Date of Birth: 02-Aug-1956  [facility]) CM/SW [place], RN Phone Number: 09/23/2022, 10:20 AM  Clinical Narrative:     The patient is set up with [facility] for [facility] services prior to Surgery, she will need a RW and 3 in 1, [employer] to deliver to the bedside       Expected Discharge Plan and Services                                               Social Determinants of Health (SDOH) Interventions SDOH Screenings   Tobacco Use: Low Risk  (09/23/2022)    Readmission Risk Interventions     No data to display

## 2022-09-23 NOTE — H&P (Signed)
History of Present Illness:  Brooke Payne is a 67 y.o. female that presents to clinic today for her preoperative history and evaluation. Patient presents with her husband. The patient is scheduled to undergo a right total knee arthroplasty on 09/23/21 by Dr. Roland Rack. Her pain began several years ago. The pain is located along the anterior and medial aspects of the knee. She describes her pain as aching, dull, stabbing, and throbbing. Symptoms are aggravated by daily activities including ambulation and exercising. She reports associated mild swelling, occasional catching and giving way of the knee. She denies associated numbness or tingling.   The patient's symptoms have progressed to the point that they decrease her quality of life. The patient has previously undergone conservative treatment including NSAIDS and injections to the knee without adequate control of her symptoms.  Patient denies history of lumbar surgery, DVT, or significant cardiac history. Her husband will be at home to assist post-operatively.   Patient urine culture showed no significant growth, but she does reports some urinary urgency, denies burning or increased frequency. No pertinent drug allergies.  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 Oliva Bustard on yearly basis for screening purposes)  COLONOSCOPY 11/03/2007 (Hyperplastic Polyp)  EGD 11/03/2007 (Barrett's Esophagus)  EGD 04/08/2011 ([place] Barrett's Esophagus)  COLONOSCOPY 05/31/2014 (Int Hemorrhoids: CBF 05/2024)  EGD 05/31/2014 ([place] Barrett's Esophagus: CBF 05/2024)  Cervical spine surgery  04/28/2016 (Cervical fusion)  EGD 01/01/2020 (Reflux esophagitis/No Repeat/TKT)   Current Medications:  celecoxib (CELEBREX) 200 MG capsule TAKE ONE CAPSULE BY MOUTH TWICE A DAY (Patient taking differently: Take 200 mg by mouth 2 (two) times daily as needed for Pain) 60 capsule 0  flaxseed oiL Oil Use 1 tablet 2 (two) times daily  levothyroxine (SYNTHROID) 75 MCG tablet Take 1 tablet (75 mcg total) by mouth every morning before breakfast (0630) 30 TO 60 MINUTES BEFORE BREAKFAST ON AN EMPTY STOMACH AND WITH A GLASS OF WATER 90 tablet 3  omeprazole (PRILOSEC) 40 MG DR capsule Take 1 capsule (40 mg total) by mouth once daily 90 capsule 3  rosuvastatin (CRESTOR) 10 MG tablet Take 1 tablet (10 mg total) by mouth once daily 90 tablet 3  spironolactone (ALDACTONE) 50 MG tablet Take 1 tablet (50 mg total) by mouth once daily 90 tablet 4   Allergies:  Zithromax [Azithromycin] Anaphylaxis   Social History:   Socioeconomic History  Marital status: Married  Tobacco Use  Smoking status: Never  Smokeless tobacco: Never  Vaping Use  Vaping Use: Never used  Substance and Sexual Activity  Alcohol use: Yes  Alcohol/week: 0.0 standard drinks of alcohol  Comment: Occasional glass of wine, 1-2 monthly  Drug use: No  Sexual activity: Yes  Partners: Male  Birth control/protection: Post-menopausal   Family History:  Seizures Mother  Brain/spinal defects Mother  Brain Tumor  Leukemia Father  Heart disease Father  Abnormal EKG Father  Heart disease Brother  Heart disease Brother  Lung cancer Brother  No Known Problems Son  No Known Problems Daughter   Review of Systems:  A 10+ ROS was performed, reviewed, and the pertinent orthopaedic findings are documented in the HPI.   Physical Examination:  BP (!) 140/92 (  BP Location: Left upper arm, Patient Position: Sitting, BP Cuff Size: Large Adult)  Ht 170.2 cm ('5\' 7"'$ )  Wt (!) 122.7 kg (270 lb 6.4 oz)  BMI 42.35 kg/m   Patient is a  well-developed, well-nourished female in no acute distress. Patient has normal mood and affect. Patient is alert and oriented to person, place, and time.   HEENT: Atraumatic, normocephalic. Pupils equal and reactive to light. Extraocular motion intact. Noninjected sclera.  Cardiovascular: Regular rate and rhythm, with no murmurs, rubs, or gallops. Distal pulses palpable.  Respiratory: Lungs clear to auscultation bilaterally.   Right knee exam: GAIT: mild limp and uses no assistive devices. ALIGNMENT: mild varus SKIN: unremarkable SWELLING: mild EFFUSION: small WARMTH: no warmth TENDERNESS: moderate tenderness along medial joint line, mild tenderness along lateral joint line ROM: 5 to 120 degrees with mild pain at the extremes of flexion and extension McMURRAY'S: positive PATELLOFEMORAL: normal tracking with no peri-patellar tenderness and negative apprehension sign CREPITUS: Mild patellofemoral crepitance LACHMAN'S: negative PIVOT SHIFT: negative ANTERIOR DRAWER: negative POSTERIOR DRAWER: negative VARUS/VALGUS: Mildly positive pseudolaxity to varus stressing  The patient able to plantarflex and dorsiflex the right ankle. She is able to flex and extend the toes. Sensation intact over the saphenous, lateral sural cutaneous, superficial fibular, and deep fibular nerve distributions.  Tests Performed/Reviewed:  Recent x-rays of the right knee were reviewed. Images reveal complete loss of medial compartment joint space with bone-on-bone contact and osteophyte formation. No fractures or dislocations noted.  Impression:  1. Primary osteoarthritis of right knee.   Plan:  The treatment options were discussed with the patient and her husband. In addition, patient educational materials were provided regarding the diagnosis and treatment options. The patient is quite frustrated by her symptoms and functional limitations, and is ready to consider more aggressive treatment options. Therefore, I  have recommended a surgical procedure, specifically a right total knee arthroplasty. The procedure was discussed with the patient, as were the potential risks (including bleeding, infection, nerve and/or blood vessel injury, persistent or recurrent pain, loosening and/or failure of the components, dislocation, need for further surgery, blood clots, strokes, heart attacks and/or arhythmias, pneumonia, etc.) and benefits. The patient states her understanding and wishes to proceed. All of the patient's questions and concerns were answered. She can call any time with further concerns. She will follow up post-surgery, routine.     H&P reviewed and patient re-examined. No changes.

## 2022-09-23 NOTE — Discharge Instructions (Addendum)
Orthopedic discharge instructions: May shower with intact OpSite dressing beginning tomorrow. Apply ice frequently to knee or use Polar Care. Start Eliquis 1 tablet (2.5 mg) twice daily on Friday, 09/24/2022, for 2 weeks, then take aspirin 325 mg twice daily for 4 weeks. Take pain medication as prescribed when needed.  May supplement with ES Tylenol if necessary. May weight-bear as tolerated on right leg - use walker for balance and support. Follow-up in 10-14 days or as scheduled.     POLAR CARE INFORMATION  [email or web address]  How to use [place] Cold Therapy System?  YouTube   [email or web address]  OPERATING INSTRUCTIONS  Start the product With dry hands, connect the transformer to the electrical connection located on the top of the cooler. Next, plug the transformer into an appropriate electrical outlet. The unit will automatically start running at this point.  To stop the pump, disconnect electrical power.  Unplug to stop the product when not in use. Unplugging the Polar Care unit turns it off. Always unplug immediately after use. Never leave it plugged in while unattended. Remove pad.    FIRST ADD WATER TO FILL LINE, THEN ICE---Replace ice when existing ice is almost melted  1 Discuss Treatment with your [facility] Practitioner and Use Only as Prescribed 2 Apply Insulation Barrier & Cold Therapy Pad 3 Check for Moisture 4 Inspect Skin Regularly  Tips and Trouble Shooting Usage Tips 1. Use cubed or chunked ice for optimal performance. 2. It is recommended to drain the Pad between uses. To drain the pad, hold the Pad upright with the hose pointed toward the ground. Depress the black plunger and allow water to drain out. 3. You may disconnect the Pad from the unit without removing the pad from the affected area by depressing the silver tabs on the hose coupling and gently pulling the hoses apart. The Pad and unit will seal itself and  will not leak. Note: Some dripping during release is normal. 4. DO NOT RUN PUMP WITHOUT WATER! The pump in this unit is designed to run with water. Running the unit without water will cause permanent damage to the pump. 5. Unplug unit before removing lid.  TROUBLESHOOTING GUIDE Pump not running, Water not flowing to the pad, Pad is not getting cold 1. Make sure the transformer is plugged into the wall outlet. 2. Confirm that the ice and water are filled to the indicated levels. 3. Make sure there are no kinks in the pad. 4. Gently pull on the blue tube to make sure the tube/pad junction is straight. 5. Remove the pad from the treatment site and ll it while the pad is lying at; then reapply. 6. Confirm that the pad couplings are securely attached to the unit. Listen for the double clicks (Figure 1) to confirm the pad couplings are securely attached.  Leaks    Note: Some condensation on the lines, controller, and pads is unavoidable, especially in warmer climates. 1. If using a [place] Polar Care Cold Therapy unit with a detachable Cold Therapy Pad, and a leak exists (other than condensation on the lines) disconnect the pad couplings. Make sure the silver tabs on the couplings are depressed before reconnecting the pad to the pump hose; then confirm both sides of the coupling are properly clicked in. 2. If the coupling continues to leak or a leak is detected in the pad itself, stop using it and call [facility] at [phone].  Cleaning After use, empty and dry the  unit with a soft cloth. Warm water and mild detergent may be used occasionally to clean the pump and tubes.  WARNING: The [place] can be cold enough to cause serious injury, including full skin necrosis. Follow these Operating Instructions, and carefully read the Product Insert (see pouch on side of unit) and the Cold Therapy Pad Fitting Instructions (provided with each Cold Therapy Pad) prior to use.       AMBULATORY  SURGERY  DISCHARGE INSTRUCTIONS  The drugs that you were given will stay in your system until tomorrow so for the next 24 hours you should not:  Drive an automobile Make any legal decisions Drink any alcoholic beverage  You may resume regular meals tomorrow.  Today it is better to start with liquids and gradually work up to solid foods.  You may eat anything you prefer, but it is better to start with liquids, then soup and crackers, and gradually work up to solid foods.  Please notify your doctor immediately if you have any unusual bleeding, trouble breathing, redness and pain at the surgery site, drainage, fever, or pain not relieved by medication.  Additional Instructions:  Please contact your physician with any problems or Same Day Surgery at [phone], Monday through Friday 6 am to 4 pm, or [place] at [facility] number at [phone].

## 2022-09-23 NOTE — Evaluation (Signed)
Physical Therapy Evaluation Patient Details Name: Brooke Payne MRN: [id number] DOB: 11-25-1955 Today's Date: 09/23/2022  History of Present Illness  Pt is a 67 y.o. female s/p R TKA on 09/23/2022  Clinical Impression  Pt admitted with above diagnosis. Pt received upright in bed agreeable to PT. Reports at baseline pt is independent with ambulation and ADL's/IADL's.   To date pt performing bed mobility at mod-I level and transfers and gait with supervision. Pt completing ~200' of gait with RW, step through gait, and consistent RLE heel strike. Pt able to complete stairs training at minguard level with education to spouse and pt on guarding and LE sequencing. Pt returning to supine in bed in room mod-I with education and performance on HEP with good understanding with pt also educated on safe car transfer. Pt understanding of LE positioning and avoid resting with knee in flexed positions. Pt is safe and appropriate for SDDC. All needs in reach with polar care donned. Pt currently with functional limitations due to the deficits listed below (see PT Problem List). Pt will benefit from skilled PT to increase their independence and safety with mobility to allow discharge to the venue listed below.     Recommendations for follow up therapy are one component of a multi-disciplinary discharge planning process, led by the attending physician.  Recommendations may be updated based on patient status, additional functional criteria and insurance authorization.  Follow Up Recommendations Home health PT      Assistance Recommended at Discharge Intermittent Supervision/Assistance  Patient can return home with the following  A little help with walking and/or transfers;A little help with bathing/dressing/bathroom;Assistance with cooking/housework;Assist for transportation;Help with stairs or ramp for entrance    Equipment Recommendations Rolling walker (2 wheels);BSC/3in1  Recommendations for Other Services        Functional Status Assessment Patient has had a recent decline in their functional status and demonstrates the ability to make significant improvements in function in a reasonable and predictable amount of time.     Precautions / Restrictions Precautions Precautions: Knee Precaution Booklet Issued: Yes (comment) Restrictions Weight Bearing Restrictions: Yes Other Position/Activity Restrictions: WBAT      Mobility  Bed Mobility Overal bed mobility: Modified Independent               Patient Response: Cooperative  Transfers Overall transfer level: Needs assistance Equipment used: Rolling walker (2 wheels) Transfers: Sit to/from Stand Sit to Stand: Supervision           General transfer comment: VC's for hand placement    Ambulation/Gait Ambulation/Gait assistance: Supervision Gait Distance (Feet): 200 Feet Assistive device: Rolling walker (2 wheels) Gait Pattern/deviations: Step-through pattern, Decreased step length - right, Decreased step length - left, Decreased stance time - right, Antalgic       General Gait Details: Able to perform consistent step through gait with mild antalgia on RLE in stance phase.  Stairs Stairs: Yes Stairs assistance: Min guard Stair Management: Two rails, Step to pattern, Forwards Number of Stairs: 4 General stair comments: Educated pt's spouse on safe guarding technique. VC's for safe UE/LE sequencing.  Wheelchair Mobility    Modified Rankin (Stroke Patients Only)       Balance Overall balance assessment: Needs assistance Sitting-balance support: Bilateral upper extremity supported, Feet supported Sitting balance-Leahy Scale: Good     Standing balance support: Bilateral upper extremity supported, During functional activity Standing balance-Leahy Scale: Fair Standing balance comment: Able to stand at sink and wash hands without UE support.  Pertinent Vitals/Pain Pain  Assessment Pain Assessment: Faces Faces Pain Scale: Hurts a little bit Pain Location: RLE Pain Descriptors / Indicators: Discomfort Pain Intervention(s): Limited activity within patient's tolerance, Monitored during session, Ice applied, Repositioned    Home Living Family/patient expects to be discharged to:: Private residence Living Arrangements: Spouse/significant other Available Help at Discharge: Family;Available 24 hours/day Type of Home: House Home Access: Stairs to enter Entrance Stairs-Rails:  (2 porch posts) Technical brewer of Steps: 2   Home Layout: One level Home Equipment: None      Prior Function Prior Level of Function : Independent/Modified Independent                     Hand Dominance        Extremity/Trunk Assessment        Lower Extremity Assessment Lower Extremity Assessment: Overall WFL for tasks assessed;RLE deficits/detail RLE Deficits / Details: R TKA RLE Sensation: WNL    Cervical / Trunk Assessment Cervical / Trunk Assessment: Normal  Communication   Communication: No difficulties  Cognition Arousal/Alertness: Awake/alert Behavior During Therapy: WFL for tasks assessed/performed Overall Cognitive Status: Within Functional Limits for tasks assessed                                          General Comments      Exercises Total Joint Exercises Ankle Circles/Pumps: AROM, Strengthening, Both, 20 reps, Supine Quad Sets: AROM, Strengthening, Right, 10 reps, Supine Short Arc Quad: AROM, Strengthening, Right, 15 reps, Supine Heel Slides: AROM, Strengthening, Right, 15 reps, Supine Hip ABduction/ADduction: AROM, Strengthening, Right, 15 reps, Supine Straight Leg Raises: AROM, Strengthening, Right, 10 reps, Supine Other Exercises Other Exercises: Role of PT in acute setting, d/c recs, WB precautions, car transfer, stair training, HEP (reps/sets/frequency)   Assessment/Plan    PT Assessment Patient needs  continued PT services  PT Problem List Decreased range of motion;Decreased balance;Decreased mobility       PT Treatment Interventions DME instruction;Therapeutic exercise;Gait training;Balance training;Stair training;Neuromuscular re-education;Functional mobility training;Therapeutic activities;Patient/family education    PT Goals (Current goals can be found in the Care Plan section)  Acute Rehab PT Goals Patient Stated Goal: Return home PT Goal Formulation: With patient/family Time For Goal Achievement: 10/07/22 Potential to Achieve Goals: Good    Frequency BID     Co-evaluation               AM-PAC PT "6 Clicks" Mobility  Outcome Measure Help needed turning from your back to your side while in a flat bed without using bedrails?: None Help needed moving from lying on your back to sitting on the side of a flat bed without using bedrails?: None Help needed moving to and from a bed to a chair (including a wheelchair)?: A Little Help needed standing up from a chair using your arms (e.g., wheelchair or bedside chair)?: A Little Help needed to walk in hospital room?: A Little Help needed climbing 3-5 steps with a railing? : A Little 6 Click Score: 20    End of Session Equipment Utilized During Treatment: Gait belt Activity Tolerance: Patient tolerated treatment well Patient left: in bed;with call bell/phone within reach;with family/visitor present Nurse Communication: Mobility status PT Visit Diagnosis: Other abnormalities of gait and mobility (R26.89);Muscle weakness (generalized) (M62.81)    Time: 2725-3664 PT Time Calculation (min) (ACUTE ONLY): 32 min   Charges:   PT Evaluation $  PT Eval Low Complexity: 1 Low PT Treatments $Gait Training: 8-22 mins       Salem Caster. Fairly IV, PT, DPT Physical Therapist- [facility]  09/23/2022, 2:13 PM

## 2022-09-23 NOTE — Anesthesia Procedure Notes (Signed)
Spinal  Patient location during procedure: OR Start time: 09/23/2022 7:43 AM End time: 09/23/2022 7:53 AM Reason for block: surgical anesthesia Staffing Performed: anesthesiologist  Anesthesiologist: Darrin Nipper, MD Resident/CRNA: Bynum, [place], CRNA Performed by: Darrin Nipper, MD Authorized by: Darrin Nipper, MD   Preanesthetic Checklist Completed: patient identified, IV checked, site marked, risks and benefits discussed, surgical consent, monitors and equipment checked, pre-op evaluation and timeout performed Spinal Block Patient position: sitting Prep: ChloraPrep Patient monitoring: heart rate, cardiac monitor, continuous pulse ox and blood pressure Approach: midline Location: L2-3 Injection technique: single-shot Needle Needle type: [place]  Needle gauge: 22 G Needle length: 9 cm Assessment Sensory level: T4 Events: CSF return Additional Notes Difficult placement with multiple attempts by two providers -- os encountered at most passes.  Ultimately placed paramedian at L2-3 by [facility].

## 2022-09-23 NOTE — Op Note (Signed)
09/23/2022  10:00 AM  Patient:   Brooke Payne  Pre-Op Diagnosis:   Degenerative joint disease, right knee.  Post-Op Diagnosis:   Same  Procedure:   Right TKA using all-cemented Bi[employer] Vanguard system with a 70 mm PCR femur, a 75 mm tibial tray with a 10 mm E-poly insert, and a 34 x 7.8 mm all-poly 3-pegged domed patella.  Surgeon:   Pascal Lux, MD  Assistant:   Cameron Proud, PA-C   Anesthesia:   Spinal  Findings:   As above  Complications:   None  EBL:   50 cc  Fluids:   700 cc crystalloid  UOP:   None  TT:   90 minutes at 300 mmHg  Drains:   None  Closure:   Staples  Implants:   As above  Brief Clinical Note:   The patient is a 67 year old female with a long history of progressively worsening right knee pain. The patient's symptoms have progressed despite medications, activity modification, injections, etc. The patient's history and examination were consistent with advanced degenerative joint disease of the right knee confirmed by plain radiographs. The patient presents at this time for a right total knee arthroplasty.  Procedure:   The patient was brought into the operating room. After adequate spinal anesthesia was obtained, the patient was lain in the supine position before the right lower extremity was prepped with ChloraPrep solution and draped sterilely. Preoperative antibiotics were administered. A timeout was performed to verify the appropriate surgical site before the limb was exsanguinated with an Esmarch and the tourniquet inflated to 300 mmHg.   A standard anterior approach to the knee was made through an approximately 7 inch incision. The incision was carried down through the subcutaneous tissues to expose superficial retinaculum. This was split the length of the incision and the medial flap elevated sufficiently to expose the medial retinaculum. The medial retinaculum was incised, leaving a 3-4 mm cuff of tissue on the patella. This was extended distally  along the medial border of the patellar tendon and proximally through the medial third of the quadriceps tendon. A subtotal fat pad excision was performed before the soft tissues were elevated off the anteromedial and anterolateral aspects of the proximal tibia to the level of the collateral ligaments. The anterior portions of the medial and lateral menisci were removed, as was the anterior cruciate ligament. With the knee flexed to 90, the external tibial guide was positioned and the appropriate proximal tibial cut made. This piece was taken to the back table where it was measured and found to be optimally replicated by a 75 mm component.  Attention was directed to the distal femur. The intramedullary canal was accessed through a 3/8" drill hole. The intramedullary guide was inserted and positioned in order to obtain a neutral flexion gap. The intercondylar block was positioned with care taken to avoid notching the anterior cortex of the femur. The appropriate cut was made. Next, the distal cutting block was placed at 5 of valgus alignment. Using the 9 mm slot, the distal cut was made. The distal femur was measured and found to be optimally replicated by the 70 mm component. The 70 mm 4-in-1 cutting block was positioned and first the posterior, then the posterior chamfer, the anterior chamfer, and finally the anterior cuts were made. At this point, the posterior portions medial and lateral menisci were removed. A trial reduction was performed using the appropriate femoral and tibial components with the 10 mm insert. This demonstrated excellent  stability to varus and valgus stressing both in flexion and extension while permitting full extension. Patella tracking was assessed and found to be excellent. Therefore, the tibial guide position was marked on the proximal tibia. The patella thickness was measured and found to be 21 mm. Therefore, the appropriate cut was made. The patellar surface was measured and found  to be optimally replicated by the 34 mm component. The three peg holes were drilled in place before the trial button was inserted. Patella tracking was assessed and found to be excellent, passing the "no thumb test". The lug holes were drilled into the distal femur before the trial component was removed, leaving only the tibial tray. The keel was then created using the appropriate tower, reamer, and punch.  The bony surfaces were prepared for cementing by irrigating them thoroughly with sterile saline solution via the jet lavage system. A bone plug was fashioned from some of the bone that had been removed previously and used to plug the distal femoral canal. In addition, 20 cc of Exparel diluted out to 60 cc with normal saline and 30 cc of 0.5% Sensorcaine were injected into the postero-medial and postero-lateral aspects of the knee, the medial and lateral gutter regions, and the peri-incisional tissues to help with postoperative analgesia. Meanwhile, the cement was being mixed on the back table. When it was ready, the tibial tray was cemented in first. The excess cement was removed using [employer] fast streamer. Next, the femoral component was impacted into place. Again, the excess cement was removed using [employer] fast streamer. The 10 mm trial insert was positioned and the knee brought into extension while the cement hardened. Finally, the patella was cemented into place and secured using the patellar clamp. Again, the excess cement was removed using [employer] fast streamer. Once the cement had hardened, the knee was placed through a range of motion with the findings as described above. Therefore, the trial insert was removed and, after verifying that no cement had been retained posteriorly, the permanent 10 mm anterior stabilized E-polyethylene insert was positioned and secured using the appropriate key locking mechanism. Again the knee was placed through a range of motion with the findings as described above.  The wound was  copiously irrigated with sterile saline solution using the jet lavage system before the quadriceps tendon and retinacular layer were reapproximated using #0 Vicryl interrupted sutures. The superficial retinacular layer also was closed using a running #0 Vicryl suture. A total of 10 cc of transexemic acid (TXA) was injected intra-articularly before the subcutaneous tissues were closed in several layers using 2-0 Vicryl interrupted sutures. The skin was closed using staples. A sterile honeycomb dressing was applied to the skin before the leg was wrapped with an Ace wrap to accommodate the Polar Care device. The patient was then awakened and returned to the recovery room in satisfactory condition after tolerating the procedure well.

## 2022-09-23 NOTE — Progress Notes (Signed)
Patient is not able to walk the distance required to go the bathroom, or he/she is unable to safely negotiate stairs required to access the bathroom.  A 3in1 BSC will alleviate this problem  

## 2022-09-23 NOTE — Transfer of Care (Signed)
Immediate Anesthesia Transfer of Care Note  Patient: Brooke Payne  Procedure(s) Performed: TOTAL KNEE ARTHROPLASTY (Right: Knee)  Patient Location: PACU  Anesthesia Type:General and Spinal  Level of Consciousness: awake, alert , and oriented  Airway & Oxygen Therapy: Patient Spontanous Breathing and Patient connected to face mask oxygen  Post-op Assessment: Report given to RN and Post -op Vital signs reviewed and stable  Post vital signs: Reviewed and stable  Last Vitals:  Vitals Value Taken Time  BP 120/61 09/23/22 1015  Temp 36.5 C 09/23/22 1008  Pulse 89 09/23/22 1020  Resp 13 09/23/22 1020  SpO2 93 % 09/23/22 1020  Vitals shown include unvalidated device data.  Last Pain:  Vitals:   09/23/22 1015  TempSrc:   PainSc: 0-No pain         Complications: No notable events documented.

## 2022-09-23 NOTE — Anesthesia Preprocedure Evaluation (Addendum)
Anesthesia Evaluation  Patient identified by MRN, date of birth, ID band Patient awake    Reviewed: Allergy & Precautions, NPO status , Patient's Chart, lab work & pertinent test results  History of Anesthesia Complications Negative for: history of anesthetic complications  Airway Mallampati: II   Neck ROM: Full    Dental  (+) Upper Dentures   Pulmonary  Laryngeal CA s/p radiation   Pulmonary exam normal breath sounds clear to auscultation       Cardiovascular hypertension, Normal cardiovascular exam Rhythm:Regular Rate:Normal  ECG 09/10/22:  Normal sinus rhythm Right ventricular hypertrophy   Neuro/Psych negative neurological ROS     GI/Hepatic ,GERD (Barrett esophagus)  ,,Fatty liver   Endo/Other  Hypothyroidism  Class 3 obesity  Renal/GU negative Renal ROS     Musculoskeletal  (+) Arthritis ,    Abdominal   Peds  Hematology negative hematology ROS (+)   Anesthesia Other Findings   Reproductive/Obstetrics                             Anesthesia Physical Anesthesia Plan  ASA: 3  Anesthesia Plan: General and Spinal   Post-op Pain Management:    Induction: Intravenous  PONV Risk Score and Plan: 3 and Propofol infusion, TIVA, Treatment may vary due to age or medical condition and Ondansetron  Airway Management Planned: Natural Airway and Nasal Cannula  Additional Equipment:   Intra-op Plan:   Post-operative Plan:   Informed Consent: I have reviewed the patients History and Physical, chart, labs and discussed the procedure including the risks, benefits and alternatives for the proposed anesthesia with the patient or authorized representative who has indicated his/her understanding and acceptance.       Plan Discussed with: CRNA  Anesthesia Plan Comments: (Plan for spinal and GA with natural airway, LMA/GETA backup.  Patient consented for risks of anesthesia including  but not limited to:  - adverse reactions to medications - damage to eyes, teeth, lips or other oral mucosa - nerve damage due to positioning  - sore throat or hoarseness - headache, bleeding, infection, nerve damage 2/2 spinal - damage to heart, brain, nerves, lungs, other parts of body or loss of life  Informed patient about role of CRNA in peri- and intra-operative care.  Patient voiced understanding.)        Anesthesia Quick Evaluation

## 2022-09-24 ENCOUNTER — Encounter: Payer: Self-pay | Admitting: Surgery

## 2023-04-29 ENCOUNTER — Other Ambulatory Visit: Payer: Self-pay | Admitting: Family Medicine

## 2023-04-29 DIAGNOSIS — Z1231 Encounter for screening mammogram for malignant neoplasm of breast: Secondary | ICD-10-CM

## 2023-05-10 ENCOUNTER — Ambulatory Visit
Admission: RE | Admit: 2023-05-10 | Discharge: 2023-05-10 | Disposition: A | Discharge status: Home / Self Care | Payer: Medicare Other | Source: Ambulatory Visit | Attending: Family Medicine | Admitting: Family Medicine

## 2023-05-10 DIAGNOSIS — Z1231 Encounter for screening mammogram for malignant neoplasm of breast: Secondary | ICD-10-CM | POA: Diagnosis present

## 2024-03-27 ENCOUNTER — Other Ambulatory Visit: Payer: Self-pay | Admitting: Family Medicine

## 2024-03-27 DIAGNOSIS — Z1231 Encounter for screening mammogram for malignant neoplasm of breast: Secondary | ICD-10-CM

## 2024-05-10 ENCOUNTER — Ambulatory Visit
Admission: RE | Admit: 2024-05-10 | Discharge: 2024-05-10 | Disposition: A | Discharge status: Home / Self Care | Source: Ambulatory Visit | Attending: Family Medicine | Admitting: Family Medicine

## 2024-05-10 DIAGNOSIS — Z1231 Encounter for screening mammogram for malignant neoplasm of breast: Secondary | ICD-10-CM | POA: Diagnosis present

## 2024-07-27 ENCOUNTER — Ambulatory Visit
Admission: RE | Admit: 2024-07-27 | Discharge: 2024-07-27 | Disposition: A | Discharge status: Home / Self Care | Attending: Gastroenterology | Admitting: Gastroenterology

## 2024-07-27 ENCOUNTER — Ambulatory Visit: Admitting: Certified Registered Nurse Anesthetist

## 2024-07-27 ENCOUNTER — Encounter: Payer: Self-pay | Admitting: Gastroenterology

## 2024-07-27 ENCOUNTER — Encounter: Admission: RE | Disposition: A | Payer: Self-pay | Source: Home / Self Care | Attending: Gastroenterology

## 2024-07-27 DIAGNOSIS — Z79899 Other long term (current) drug therapy: Secondary | ICD-10-CM | POA: Insufficient documentation

## 2024-07-27 DIAGNOSIS — E039 Hypothyroidism, unspecified: Secondary | ICD-10-CM | POA: Insufficient documentation

## 2024-07-27 DIAGNOSIS — Z7901 Long term (current) use of anticoagulants: Secondary | ICD-10-CM | POA: Diagnosis not present

## 2024-07-27 DIAGNOSIS — E785 Hyperlipidemia, unspecified: Secondary | ICD-10-CM | POA: Insufficient documentation

## 2024-07-27 DIAGNOSIS — I10 Essential (primary) hypertension: Secondary | ICD-10-CM | POA: Insufficient documentation

## 2024-07-27 DIAGNOSIS — Z791 Long term (current) use of non-steroidal anti-inflammatories (NSAID): Secondary | ICD-10-CM | POA: Insufficient documentation

## 2024-07-27 DIAGNOSIS — Z79890 Hormone replacement therapy (postmenopausal): Secondary | ICD-10-CM | POA: Diagnosis not present

## 2024-07-27 DIAGNOSIS — M1990 Unspecified osteoarthritis, unspecified site: Secondary | ICD-10-CM | POA: Insufficient documentation

## 2024-07-27 DIAGNOSIS — K5730 Diverticulosis of large intestine without perforation or abscess without bleeding: Secondary | ICD-10-CM | POA: Diagnosis not present

## 2024-07-27 DIAGNOSIS — D124 Benign neoplasm of descending colon: Secondary | ICD-10-CM | POA: Diagnosis not present

## 2024-07-27 DIAGNOSIS — Z1211 Encounter for screening for malignant neoplasm of colon: Secondary | ICD-10-CM | POA: Diagnosis present

## 2024-07-27 HISTORY — PX: COLONOSCOPY: SHX5424

## 2024-07-27 HISTORY — PX: POLYPECTOMY: SHX149

## 2024-07-27 SURGERY — COLONOSCOPY
Anesthesia: General

## 2024-07-27 MED ORDER — SODIUM CHLORIDE 0.9 % IV SOLN
INTRAVENOUS | Status: DC
  Administered 2024-07-27: 20 mL/h via INTRAVENOUS

## 2024-07-27 MED ORDER — PROPOFOL 500 MG/50ML IV EMUL
INTRAVENOUS | Status: DC | PRN
  Administered 2024-07-27: 150 ug/kg/min via INTRAVENOUS

## 2024-07-27 MED ORDER — GLYCOPYRROLATE 0.2 MG/ML IJ SOLN
INTRAMUSCULAR | Status: DC | PRN
  Administered 2024-07-27: .2 mg via INTRAVENOUS

## 2024-07-27 MED ORDER — GLYCOPYRROLATE 0.2 MG/ML IJ SOLN
INTRAMUSCULAR | Status: AC
  Filled 2024-07-27: qty 1

## 2024-07-27 MED ORDER — PROPOFOL 10 MG/ML IV BOLUS
INTRAVENOUS | Status: DC | PRN
  Administered 2024-07-27: 70 mg via INTRAVENOUS

## 2024-07-27 NOTE — Anesthesia Procedure Notes (Signed)
 Date/Time: 07/27/2024 9:23 AM  Performed by: Duwayne Craven, CRNAPre-anesthesia Checklist: Patient identified, Emergency Drugs available, Suction available, Patient being monitored and Timeout performed Patient Re-evaluated:Patient Re-evaluated prior to induction Oxygen Delivery Method: Nasal cannula Induction Type: IV induction Placement Confirmation: CO2 detector and positive ETCO2

## 2024-07-27 NOTE — Anesthesia Postprocedure Evaluation (Signed)
 Anesthesia Post Note  Patient: Brooke Payne  Procedure(s) Performed: COLONOSCOPY POLYPECTOMY, INTESTINE  Patient location during evaluation: Endoscopy Anesthesia Type: General Level of consciousness: awake and alert Pain management: pain level controlled Vital Signs Assessment: post-procedure vital signs reviewed and stable Respiratory status: spontaneous breathing, nonlabored ventilation and respiratory function stable Cardiovascular status: blood pressure returned to baseline and stable Postop Assessment: no apparent nausea or vomiting Anesthetic complications: no   No notable events documented.   Last Vitals:  Vitals:   07/27/24 1010 07/27/24 1013  BP: 129/72 126/74  Pulse: 74 70  Resp: 18 20  Temp:    SpO2: 98% 95%    Last Pain:  Vitals:   07/27/24 0957  TempSrc: Temporal  PainSc:                  Fairy POUR Debie Ashline

## 2024-07-27 NOTE — Op Note (Signed)
 [facility] Gastroenterology Patient Name: Brooke Payne Procedure Date: 07/27/2024 9:16 AM MRN: [id number] Account #: [id number] Date of Birth: 1955/11/17 Admit Type: Outpatient Age: 68 Room: [facility] ENDO ROOM 4 Gender: Female Note Status: Finalized Instrument Name: Colon Scope [id number] Procedure:             Colonoscopy Indications:           Screening for colorectal malignant neoplasm Providers:             Ruel Kung MD, MD Referring MD:          Lynwood FALCON. Valora, MD (Referring MD) Medicines:             Monitored Anesthesia Care Complications:         No immediate complications. Procedure:             Pre-Anesthesia Assessment:                        - Prior to the procedure, a History and Physical was                         performed, and patient medications, allergies and                         sensitivities were reviewed. The patient's tolerance                         of previous anesthesia was reviewed.                        - The risks and benefits of the procedure and the                         sedation options and risks were discussed with the                         patient. All questions were answered and informed                         consent was obtained.                        - ASA Grade Assessment: II - A patient with mild                         systemic disease.                        After obtaining informed consent, the colonoscope was                         passed under direct vision. Throughout the procedure,                         the patient's blood pressure, pulse, and oxygen                         saturations were monitored continuously. The                         Colonoscope  was introduced through the anus and                         advanced to the the cecum, identified by the                         appendiceal orifice. The colonoscopy was performed                         with ease. The patient tolerated the procedure  well.                         The quality of the bowel preparation was excellent.                         The ileocecal valve, appendiceal orifice, and rectum                         were photographed. Findings:      The perianal and digital rectal examinations were normal.      A 5 mm polyp was found in the descending colon. The polyp was sessile.       The polyp was removed with a cold snare. Resection and retrieval were       complete.      The exam was otherwise without abnormality on direct and retroflexion       views.      Multiple medium-mouthed diverticula were found in the sigmoid colon. Impression:            - One 5 mm polyp in the descending colon, removed with                         a cold snare. Resected and retrieved.                        - The examination was otherwise normal on direct and                         retroflexion views. Recommendation:        - Discharge patient to home (with escort).                        - Resume previous diet.                        - Continue present medications.                        - Await pathology results.                        - Repeat colonoscopy for surveillance based on                         pathology results. Procedure Code(s):     --- Professional ---                        [phone], Colonoscopy, flexible; with removal of  tumor(s), polyp(s), or other lesion(s) by snare                         technique Diagnosis Code(s):     --- Professional ---                        Z12.11, Encounter for screening for malignant neoplasm                         of colon                        D12.4, Benign neoplasm of descending colon CPT copyright 2022 American Medical Association. All rights reserved. The codes documented in this report are preliminary and upon coder review may  be revised to meet current compliance requirements. Ruel Kung, MD Ruel Kung MD, MD 07/27/2024 9:50:32 AM This report has been  signed electronically. Number of Addenda: 0 Note Initiated On: 07/27/2024 9:16 AM Scope Withdrawal Time: 0 hours 9 minutes 27 seconds  Total Procedure Duration: 0 hours 19 minutes 41 seconds  Estimated Blood Loss:  Estimated blood loss: none.      [facility]

## 2024-07-27 NOTE — Transfer of Care (Signed)
 Immediate Anesthesia Transfer of Care Note  Patient: Brooke Payne  Procedure(s) Performed: COLONOSCOPY POLYPECTOMY, INTESTINE  Patient Location: PACU  Anesthesia Type:General  Level of Consciousness: drowsy  Airway & Oxygen Therapy: Patient Spontanous Breathing  Post-op Assessment: Report given to RN and Post -op Vital signs reviewed and stable  Post vital signs: Reviewed and stable  Last Vitals:  Vitals Value Taken Time  BP    Temp    Pulse 84 07/27/24 09:52  Resp 17 07/27/24 09:52  SpO2 94 % 07/27/24 09:52  Vitals shown include unfiled device data.  Last Pain:  Vitals:   07/27/24 0828  TempSrc: Temporal  PainSc: 0-No pain         Complications: No notable events documented.

## 2024-07-27 NOTE — H&P (Signed)
 Ruel Kung , MD [address], [address], [place], [place], [address] Phone: [phone] Fax: [phone]  Primary Care Physician:  Valora Lynwood FALCON, MD   Pre-Procedure History & Physical: HPI:  Brooke Payne is a 68 y.o. female is here for an colonoscopy.   Past Medical History:  Diagnosis Date   Arthritis    Barrett's esophagus    Cancer (HCC) 1992   laryngeal ca   Fatty liver    Hyperlipidemia    Hypertension    Hypothyroidism    Personal history of radiation therapy 1992    Past Surgical History:  Procedure Laterality Date   BREAST BIOPSY Left 12/12/2014    MICROCALCIFICATIONS ASSOCIATED WITHIN ADIPOSE TISSUE   CERVICAL SPINE SURGERY  2017   COLONOSCOPY     LARYNX SURGERY     TOTAL KNEE ARTHROPLASTY Right 09/23/2022   Procedure: TOTAL KNEE ARTHROPLASTY;  Surgeon: Edie Norleen PARAS, MD;  Location: [facility] ORS;  Service: Orthopedics;  Laterality: Right;  MAKE 1ST CASE    Prior to Admission medications   Medication Sig Start Date End Date Taking? Authorizing Provider  celecoxib (CELEBREX) 50 MG capsule Take 50 mg by mouth 2 (two) times daily.   Yes [provider]  Flaxseed, Linseed, (FLAXSEED OIL PO) Take 1 tablet by mouth 2 (two) times daily.   Yes [provider]  levothyroxine (SYNTHROID) 75 MCG tablet Take 75 mcg by mouth daily before breakfast.   Yes [provider]  omeprazole (PRILOSEC) 40 MG capsule Take 40 mg by mouth every morning.   Yes [provider]  rosuvastatin (CRESTOR) 10 MG tablet Take 10 mg by mouth every morning.   Yes [provider]  spironolactone (ALDACTONE) 50 MG tablet Take 50 mg by mouth every morning.   Yes [provider]  apixaban  (ELIQUIS ) 2.5 MG TABS tablet Take 1 tablet (2.5 mg total) by mouth 2 (two) times daily. Patient not taking: Reported on 07/27/2024 09/24/22    Poggi, Norleen PARAS, MD  oxyCODONE  (ROXICODONE ) 5 MG immediate release tablet Take 1-2 tablets (5-10 mg total) by mouth every 4 (four) hours as needed for moderate pain or severe pain. Patient not taking: Reported on 07/27/2024 09/23/22   Poggi, Norleen PARAS, MD    Allergies as of 07/16/2024 - Review Complete 09/23/2022  Allergen Reaction Noted   Azithromycin Shortness Of Breath 02/17/2018    Family History  Problem Relation Age of Onset   Breast cancer Neg Hx     Social History   Socioeconomic History   Marital status: Married    Spouse name: Not on file   Number of children: Not on file   Years of education: Not on file   Highest education level: Not on file  Occupational History   Not on file  Tobacco Use   Smoking status: Never   Smokeless tobacco: Never  Vaping Use   Vaping status: Never Used  Substance and Sexual Activity   Alcohol use: Yes    Comment: occ   Drug use: Never   Sexual activity: Not on file  Other Topics Concern   Not on file  Social History Narrative   Not on file   Social Drivers of Health   Financial Resource Strain: Low Risk  (07/16/2024)   Received from [facility]   Overall Financial Resource Strain (CARDIA)    Difficulty of Paying Living Expenses: Not hard at all  Food Insecurity: No Food Insecurity (07/16/2024)   Received from  [facility]   Hunger Vital Sign    Within the past 12 months, you worried that your food would run out before you got the money to buy more.: Never true    Within the past 12 months, the food you bought just didn't last and you didn't have money to get more.: Never true  Transportation Needs: No Transportation Needs (07/16/2024)   Received from [facility] - Transportation    In the past 12 months, has lack of transportation kept you from medical appointments or from getting medications?: No    Lack of Transportation (Non-Medical): No  Physical Activity: Not on  file  Stress: Not on file  Social Connections: Not on file  Intimate Partner Violence: Not on file    Review of Systems: See HPI, otherwise negative ROS  Physical Exam: BP (!) 149/85   Pulse 90   Temp (!) 96.5 F (35.8 C) (Temporal)   Resp 20   Ht 5' 7 (1.702 m)   Wt 122 kg   SpO2 97%   BMI 42.13 kg/m  General:   Alert,  pleasant and cooperative in NAD Head:  Normocephalic and atraumatic. Neck:  Supple; no masses or thyromegaly. Lungs:  Clear throughout to auscultation, normal respiratory effort.    Heart:  +S1, +S2, Regular rate and rhythm, No edema. Abdomen:  Soft, nontender and nondistended. Normal bowel sounds, without guarding, and without rebound.   Neurologic:  Alert and  oriented x4;  grossly normal neurologically.  Impression/Plan: Brooke Payne is here for an colonoscopy to be performed for Screening colonoscopy average risk   Risks, benefits, limitations, and alternatives regarding  colonoscopy have been reviewed with the patient.  Questions have been answered.  All parties agreeable.   Ruel Kung, MD  07/27/2024, 8:39 AM

## 2024-07-27 NOTE — Anesthesia Preprocedure Evaluation (Signed)
 Anesthesia Evaluation  Patient identified by MRN, date of birth, ID band Patient awake    Reviewed: Allergy & Precautions, NPO status , Patient's Chart, lab work & pertinent test results  History of Anesthesia Complications Negative for: history of anesthetic complications  Airway Mallampati: III  TM Distance: <3 FB Neck ROM: full    Dental  (+) Chipped, Poor Dentition, Missing, Partial Upper   Pulmonary neg pulmonary ROS, neg shortness of breath   Pulmonary exam normal        Cardiovascular Exercise Tolerance: Good hypertension, (-) angina Normal cardiovascular exam     Neuro/Psych negative neurological ROS  negative psych ROS   GI/Hepatic negative GI ROS, Neg liver ROS,neg GERD  ,,  Endo/Other  Hypothyroidism    Renal/GU negative Renal ROS  negative genitourinary   Musculoskeletal   Abdominal   Peds  Hematology negative hematology ROS (+)   Anesthesia Other Findings Past Medical History: No date: Arthritis No date: Barrett's esophagus 1992: Cancer (HCC)     Comment:  laryngeal ca No date: Fatty liver No date: Hyperlipidemia No date: Hypertension No date: Hypothyroidism 1992: Personal history of radiation therapy  Past Surgical History: 12/12/2014: BREAST BIOPSY; Left     Comment:   MICROCALCIFICATIONS ASSOCIATED WITHIN ADIPOSE TISSUE 2017: CERVICAL SPINE SURGERY No date: COLONOSCOPY No date: LARYNX SURGERY 09/23/2022: TOTAL KNEE ARTHROPLASTY; Right     Comment:  Procedure: TOTAL KNEE ARTHROPLASTY;  Surgeon: Edie Norleen PARAS, MD;  Location: [facility] ORS;  Service: Orthopedics;                Laterality: Right;  MAKE 1ST CASE  BMI    Body Mass Index: 42.13 kg/m      Reproductive/Obstetrics negative OB ROS                              Anesthesia Physical Anesthesia Plan  ASA: 3  Anesthesia Plan: General   Post-op Pain Management:    Induction:  Intravenous  PONV Risk Score and Plan: Propofol  infusion and TIVA  Airway Management Planned: Natural Airway and Nasal Cannula  Additional Equipment:   Intra-op Plan:   Post-operative Plan:   Informed Consent: I have reviewed the patients History and Physical, chart, labs and discussed the procedure including the risks, benefits and alternatives for the proposed anesthesia with the patient or authorized representative who has indicated his/her understanding and acceptance.     Dental Advisory Given  Plan Discussed with: Anesthesiologist, CRNA and Surgeon  Anesthesia Plan Comments: (Patient consented for risks of anesthesia including but not limited to:  - adverse reactions to medications - risk of airway placement if required - damage to eyes, teeth, lips or other oral mucosa - nerve damage due to positioning  - sore throat or hoarseness - Damage to heart, brain, nerves, lungs, other parts of body or loss of life  Patient voiced understanding and assent.)        Anesthesia Quick Evaluation

## 2024-07-30 LAB — SURGICAL PATHOLOGY
